# Patient Record
Sex: Female | Born: 1975 | Race: White | Hispanic: No
Health system: Midwestern US, Academic
[De-identification: ages and names within clinical notes are randomized; demographics above are authoritative.]

---

## 2017-04-22 ENCOUNTER — Encounter: Admit: 2017-04-22 | Discharge: 2017-04-23 | Payer: MEDICAID

## 2017-04-22 DIAGNOSIS — R69 Illness, unspecified: Principal | ICD-10-CM

## 2017-05-02 ENCOUNTER — Encounter: Admit: 2017-05-02 | Discharge: 2017-05-03 | Payer: MEDICAID

## 2017-05-02 DIAGNOSIS — R69 Illness, unspecified: Principal | ICD-10-CM

## 2017-05-25 ENCOUNTER — Ambulatory Visit: Admit: 2017-05-25 | Discharge: 2017-05-25 | Payer: MEDICAID

## 2017-05-25 ENCOUNTER — Encounter: Admit: 2017-05-25 | Discharge: 2017-05-25 | Payer: MEDICAID

## 2017-05-25 ENCOUNTER — Ambulatory Visit: Admit: 2017-05-25 | Discharge: 2017-05-25 | Payer: BC Managed Care – PPO

## 2017-05-25 DIAGNOSIS — M542 Cervicalgia: Principal | ICD-10-CM

## 2017-05-25 DIAGNOSIS — F319 Bipolar disorder, unspecified: ICD-10-CM

## 2017-05-25 DIAGNOSIS — M549 Dorsalgia, unspecified: ICD-10-CM

## 2017-05-25 DIAGNOSIS — K219 Gastro-esophageal reflux disease without esophagitis: ICD-10-CM

## 2017-05-25 DIAGNOSIS — D649 Anemia, unspecified: ICD-10-CM

## 2017-05-25 DIAGNOSIS — J45909 Unspecified asthma, uncomplicated: ICD-10-CM

## 2017-05-25 DIAGNOSIS — I10 Essential (primary) hypertension: Principal | ICD-10-CM

## 2017-05-25 DIAGNOSIS — E785 Hyperlipidemia, unspecified: ICD-10-CM

## 2017-05-25 DIAGNOSIS — M79601 Pain in right arm: ICD-10-CM

## 2017-05-25 DIAGNOSIS — F329 Major depressive disorder, single episode, unspecified: ICD-10-CM

## 2017-05-25 DIAGNOSIS — F419 Anxiety disorder, unspecified: ICD-10-CM

## 2017-05-25 DIAGNOSIS — G8929 Other chronic pain: ICD-10-CM

## 2017-05-25 DIAGNOSIS — M1990 Unspecified osteoarthritis, unspecified site: ICD-10-CM

## 2017-05-25 DIAGNOSIS — R69 Illness, unspecified: Principal | ICD-10-CM

## 2017-05-25 DIAGNOSIS — M4802 Spinal stenosis, cervical region: Secondary | ICD-10-CM

## 2017-05-25 DIAGNOSIS — N83209 Unspecified ovarian cyst, unspecified side: ICD-10-CM

## 2017-05-25 DIAGNOSIS — R4020 Unspecified coma: ICD-10-CM

## 2017-05-26 ENCOUNTER — Encounter: Admit: 2017-05-26 | Discharge: 2017-05-26 | Payer: MEDICAID

## 2017-05-26 DIAGNOSIS — M542 Cervicalgia: Principal | ICD-10-CM

## 2017-06-06 ENCOUNTER — Ambulatory Visit: Admit: 2017-06-06 | Discharge: 2017-06-06 | Payer: BC Managed Care – PPO

## 2017-06-06 ENCOUNTER — Ambulatory Visit: Admit: 2017-06-06 | Discharge: 2017-06-06 | Payer: MEDICAID

## 2017-06-06 ENCOUNTER — Encounter: Admit: 2017-06-06 | Discharge: 2017-06-06 | Payer: MEDICAID

## 2017-06-06 DIAGNOSIS — M5021 Other cervical disc displacement,  high cervical region: ICD-10-CM

## 2017-06-06 DIAGNOSIS — G8929 Other chronic pain: ICD-10-CM

## 2017-06-06 DIAGNOSIS — M2578 Osteophyte, vertebrae: ICD-10-CM

## 2017-06-06 DIAGNOSIS — M4802 Spinal stenosis, cervical region: Principal | ICD-10-CM

## 2017-06-06 DIAGNOSIS — M79601 Pain in right arm: ICD-10-CM

## 2017-06-06 MED ORDER — IOPAMIDOL 61 % IT SOLN
15 mL | Freq: Once | INTRATHECAL | 0 refills | Status: CP
Start: 2017-06-06 — End: ?
  Administered 2017-06-06: 18:00:00 15 mL via INTRATHECAL

## 2017-06-06 MED ORDER — DIAZEPAM 5 MG PO TAB
10 mg | Freq: Once | ORAL | 0 refills | Status: CP
Start: 2017-06-06 — End: ?
  Administered 2017-06-06: 17:00:00 10 mg via ORAL

## 2017-06-06 MED ORDER — HYDROCODONE-ACETAMINOPHEN 5-325 MG PO TAB
1 | Freq: Once | ORAL | 0 refills | Status: DC
Start: 2017-06-06 — End: 2017-06-06

## 2017-06-29 ENCOUNTER — Encounter: Admit: 2017-06-29 | Discharge: 2017-06-29 | Payer: MEDICAID

## 2017-06-29 ENCOUNTER — Ambulatory Visit: Admit: 2017-06-29 | Discharge: 2017-06-30 | Payer: MEDICAID

## 2017-06-29 DIAGNOSIS — M1990 Unspecified osteoarthritis, unspecified site: ICD-10-CM

## 2017-06-29 DIAGNOSIS — F419 Anxiety disorder, unspecified: ICD-10-CM

## 2017-06-29 DIAGNOSIS — R4020 Unspecified coma: ICD-10-CM

## 2017-06-29 DIAGNOSIS — M549 Dorsalgia, unspecified: ICD-10-CM

## 2017-06-29 DIAGNOSIS — D649 Anemia, unspecified: ICD-10-CM

## 2017-06-29 DIAGNOSIS — N83209 Unspecified ovarian cyst, unspecified side: ICD-10-CM

## 2017-06-29 DIAGNOSIS — G8929 Other chronic pain: Secondary | ICD-10-CM

## 2017-06-29 DIAGNOSIS — K219 Gastro-esophageal reflux disease without esophagitis: ICD-10-CM

## 2017-06-29 DIAGNOSIS — I10 Essential (primary) hypertension: Principal | ICD-10-CM

## 2017-06-29 DIAGNOSIS — F319 Bipolar disorder, unspecified: ICD-10-CM

## 2017-06-29 DIAGNOSIS — E785 Hyperlipidemia, unspecified: ICD-10-CM

## 2017-06-29 DIAGNOSIS — J45909 Unspecified asthma, uncomplicated: ICD-10-CM

## 2017-06-29 DIAGNOSIS — M79601 Pain in right arm: Principal | ICD-10-CM

## 2017-06-29 DIAGNOSIS — F329 Major depressive disorder, single episode, unspecified: ICD-10-CM

## 2017-06-30 ENCOUNTER — Ambulatory Visit: Admit: 2017-06-29 | Discharge: 2017-06-29 | Payer: MEDICAID

## 2017-06-30 DIAGNOSIS — M79601 Pain in right arm: Principal | ICD-10-CM

## 2017-06-30 DIAGNOSIS — G8929 Other chronic pain: ICD-10-CM

## 2017-06-30 DIAGNOSIS — M4802 Spinal stenosis, cervical region: ICD-10-CM

## 2017-07-01 ENCOUNTER — Encounter: Admit: 2017-07-01 | Discharge: 2017-07-01 | Payer: MEDICAID

## 2017-07-01 DIAGNOSIS — G959 Disease of spinal cord, unspecified: Principal | ICD-10-CM

## 2017-07-04 ENCOUNTER — Encounter: Admit: 2017-07-04 | Discharge: 2017-07-04 | Payer: MEDICAID

## 2017-07-13 ENCOUNTER — Encounter: Admit: 2017-07-13 | Discharge: 2017-07-13 | Payer: MEDICAID

## 2017-07-13 ENCOUNTER — Ambulatory Visit: Admit: 2017-07-13 | Discharge: 2017-07-13 | Payer: MEDICAID

## 2017-07-13 ENCOUNTER — Ambulatory Visit: Admit: 2017-07-13 | Discharge: 2017-07-14 | Payer: MEDICAID

## 2017-07-13 DIAGNOSIS — I10 Essential (primary) hypertension: Principal | ICD-10-CM

## 2017-07-13 DIAGNOSIS — B009 Herpesviral infection, unspecified: ICD-10-CM

## 2017-07-13 DIAGNOSIS — B977 Papillomavirus as the cause of diseases classified elsewhere: ICD-10-CM

## 2017-07-13 DIAGNOSIS — E162 Hypoglycemia, unspecified: ICD-10-CM

## 2017-07-13 DIAGNOSIS — J45909 Unspecified asthma, uncomplicated: ICD-10-CM

## 2017-07-13 DIAGNOSIS — M549 Dorsalgia, unspecified: ICD-10-CM

## 2017-07-13 DIAGNOSIS — R4020 Unspecified coma: ICD-10-CM

## 2017-07-13 DIAGNOSIS — D649 Anemia, unspecified: ICD-10-CM

## 2017-07-13 DIAGNOSIS — F319 Bipolar disorder, unspecified: ICD-10-CM

## 2017-07-13 DIAGNOSIS — F419 Anxiety disorder, unspecified: ICD-10-CM

## 2017-07-13 DIAGNOSIS — N83209 Unspecified ovarian cyst, unspecified side: ICD-10-CM

## 2017-07-13 DIAGNOSIS — K219 Gastro-esophageal reflux disease without esophagitis: ICD-10-CM

## 2017-07-13 DIAGNOSIS — F329 Major depressive disorder, single episode, unspecified: ICD-10-CM

## 2017-07-13 DIAGNOSIS — M1990 Unspecified osteoarthritis, unspecified site: ICD-10-CM

## 2017-07-13 DIAGNOSIS — G959 Disease of spinal cord, unspecified: Secondary | ICD-10-CM

## 2017-07-13 LAB — COMPREHENSIVE METABOLIC PANEL
Lab: 0.6 mg/dL (ref 0.4–1.00)
Lab: 107 MMOL/L (ref 98–110)
Lab: 136 MMOL/L — ABNORMAL LOW (ref 137–147)
Lab: 3.6 MMOL/L (ref 3.5–5.1)
Lab: 5 mg/dL — ABNORMAL LOW (ref 7–25)
Lab: 75 mg/dL (ref 70–100)
Lab: 9.3 mg/dL (ref 8.5–10.6)

## 2017-07-13 LAB — CBC: Lab: 9.6 10*3/uL (ref 4.5–11.0)

## 2017-07-14 DIAGNOSIS — M4802 Spinal stenosis, cervical region: Principal | ICD-10-CM

## 2017-07-14 DIAGNOSIS — G959 Disease of spinal cord, unspecified: ICD-10-CM

## 2017-07-21 ENCOUNTER — Encounter: Admit: 2017-07-21 | Discharge: 2017-07-21 | Payer: MEDICAID

## 2017-08-25 ENCOUNTER — Encounter: Admit: 2017-08-25 | Discharge: 2017-08-25 | Payer: MEDICAID

## 2017-08-25 DIAGNOSIS — B977 Papillomavirus as the cause of diseases classified elsewhere: ICD-10-CM

## 2017-08-25 DIAGNOSIS — E162 Hypoglycemia, unspecified: ICD-10-CM

## 2017-08-25 DIAGNOSIS — B009 Herpesviral infection, unspecified: ICD-10-CM

## 2017-08-25 DIAGNOSIS — F319 Bipolar disorder, unspecified: ICD-10-CM

## 2017-08-25 DIAGNOSIS — D649 Anemia, unspecified: ICD-10-CM

## 2017-08-25 DIAGNOSIS — M549 Dorsalgia, unspecified: ICD-10-CM

## 2017-08-25 DIAGNOSIS — F329 Major depressive disorder, single episode, unspecified: ICD-10-CM

## 2017-08-25 DIAGNOSIS — F419 Anxiety disorder, unspecified: ICD-10-CM

## 2017-08-25 DIAGNOSIS — I10 Essential (primary) hypertension: Principal | ICD-10-CM

## 2017-08-25 DIAGNOSIS — N83209 Unspecified ovarian cyst, unspecified side: ICD-10-CM

## 2017-08-25 DIAGNOSIS — K219 Gastro-esophageal reflux disease without esophagitis: ICD-10-CM

## 2017-08-25 DIAGNOSIS — R4020 Unspecified coma: ICD-10-CM

## 2017-08-25 DIAGNOSIS — J45909 Unspecified asthma, uncomplicated: ICD-10-CM

## 2017-08-25 DIAGNOSIS — M1990 Unspecified osteoarthritis, unspecified site: ICD-10-CM

## 2017-09-07 ENCOUNTER — Encounter: Admit: 2017-09-07 | Discharge: 2017-09-07 | Payer: MEDICAID

## 2017-09-07 DIAGNOSIS — M4802 Spinal stenosis, cervical region: Principal | ICD-10-CM

## 2017-09-14 ENCOUNTER — Encounter: Admit: 2017-09-14 | Discharge: 2017-09-14 | Payer: MEDICAID

## 2017-09-20 ENCOUNTER — Encounter: Admit: 2017-09-20 | Discharge: 2017-09-20 | Payer: MEDICAID

## 2017-09-20 MED ORDER — METHYLPREDNISOLONE 4 MG PO DSPK
ORAL_TABLET | 0 refills | Status: AC
Start: 2017-09-20 — End: 2018-11-01

## 2017-09-27 ENCOUNTER — Encounter: Admit: 2017-09-27 | Discharge: 2017-09-27 | Payer: MEDICAID

## 2018-06-23 ENCOUNTER — Encounter: Admit: 2018-06-23 | Discharge: 2018-06-24 | Payer: MEDICAID

## 2018-08-29 ENCOUNTER — Encounter: Admit: 2018-08-29 | Discharge: 2018-08-29 | Payer: MEDICAID

## 2018-09-11 ENCOUNTER — Encounter: Admit: 2018-09-11 | Discharge: 2018-09-11 | Payer: MEDICAID

## 2018-10-23 ENCOUNTER — Encounter: Admit: 2018-10-23 | Discharge: 2018-10-23

## 2018-10-23 DIAGNOSIS — M542 Cervicalgia: Secondary | ICD-10-CM

## 2018-10-23 DIAGNOSIS — M545 Low back pain: Secondary | ICD-10-CM

## 2018-11-01 ENCOUNTER — Ambulatory Visit: Admit: 2018-11-01 | Discharge: 2018-11-01

## 2018-11-01 ENCOUNTER — Encounter: Admit: 2018-11-01 | Discharge: 2018-11-01

## 2018-11-01 DIAGNOSIS — M542 Cervicalgia: Principal | ICD-10-CM

## 2018-11-01 DIAGNOSIS — M1990 Unspecified osteoarthritis, unspecified site: Secondary | ICD-10-CM

## 2018-11-01 DIAGNOSIS — E162 Hypoglycemia, unspecified: Secondary | ICD-10-CM

## 2018-11-01 DIAGNOSIS — K219 Gastro-esophageal reflux disease without esophagitis: Secondary | ICD-10-CM

## 2018-11-01 DIAGNOSIS — M5020 Other cervical disc displacement, unspecified cervical region: Secondary | ICD-10-CM

## 2018-11-01 DIAGNOSIS — M5010 Cervical disc disorder with radiculopathy, unspecified cervical region: Secondary | ICD-10-CM

## 2018-11-01 DIAGNOSIS — N83209 Unspecified ovarian cyst, unspecified side: Secondary | ICD-10-CM

## 2018-11-01 DIAGNOSIS — J45909 Unspecified asthma, uncomplicated: Secondary | ICD-10-CM

## 2018-11-01 DIAGNOSIS — R4020 Unspecified coma: Secondary | ICD-10-CM

## 2018-11-01 DIAGNOSIS — F329 Major depressive disorder, single episode, unspecified: Secondary | ICD-10-CM

## 2018-11-01 DIAGNOSIS — B009 Herpesviral infection, unspecified: Secondary | ICD-10-CM

## 2018-11-01 DIAGNOSIS — F419 Anxiety disorder, unspecified: Secondary | ICD-10-CM

## 2018-11-01 DIAGNOSIS — M4802 Spinal stenosis, cervical region: Secondary | ICD-10-CM

## 2018-11-01 DIAGNOSIS — D649 Anemia, unspecified: Secondary | ICD-10-CM

## 2018-11-01 DIAGNOSIS — I10 Essential (primary) hypertension: Secondary | ICD-10-CM

## 2018-11-01 DIAGNOSIS — B977 Papillomavirus as the cause of diseases classified elsewhere: Secondary | ICD-10-CM

## 2018-11-01 DIAGNOSIS — F319 Bipolar disorder, unspecified: Secondary | ICD-10-CM

## 2018-11-01 DIAGNOSIS — M549 Dorsalgia, unspecified: Secondary | ICD-10-CM

## 2018-11-01 NOTE — Progress Notes
Date of Service: 11/01/2018     Subjective:               History of Present Illness    Diamond Morton is a 43 y.o. female.  She presents with a chief complaint of neck pain radiating into her bilateral shoulders, and numbness in her bilateral hands.  This is been going on for well over a year.  This is failed nonoperative management with anti-inflammatories, pain medications, muscle relaxants, oral steroids, physical therapy, and injections.  It has been getting progressively worse.  She states she can no longer feel anything in her fingers, she has trouble with fine motor movements such as buttoning her close, and often drops things held in her hands because she cannot feel them.  She cannot identify any mitigating or exacerbating factors.       Review of Systems   Constitutional: Negative.    HENT: Positive for sneezing.    Eyes: Positive for itching.   Cardiovascular: Negative.    Gastrointestinal: Positive for diarrhea.   Endocrine: Negative.    Genitourinary: Positive for urgency.   Musculoskeletal: Positive for back pain, neck pain and neck stiffness.   Skin: Negative.    Neurological: Positive for dizziness, syncope, light-headedness and numbness.   Hematological: Bruises/bleeds easily.   Psychiatric/Behavioral: Positive for sleep disturbance. The patient is nervous/anxious.          Objective:         ??? clindamycin (CLEOCIN) 300 mg capsule Take 300 mg by mouth three times daily.   ??? diclofenac sodium DR (VOLTAREN) 50 mg tablet Take 50 mg by mouth three times daily. Take with food.    ??? ferrous sulfate (FEOSOL, FEROSUL) 325 mg (65 mg iron) tablet Take 325 mg by mouth every morning.   ??? HYDROcodone/acetaminophen (NORCO) 5/325 mg tablet Take 1 tablet by mouth every 6 hours as needed (Patient taking differently: Take 1-2 tablets by mouth every 8 hours as needed )   ??? LORazepam (ATIVAN) 0.5 mg tablet Take 1 tablet by mouth every 6 hours as needed for Other.... ??? losartan(+) (COZAAR) 100 mg tablet Take 100 mg by mouth every morning.   ??? methocarbamol (ROBAXIN) 750 mg tablet Take 750 mg by mouth three times daily.   ??? methylPREDNIsolone (MEDROL DOSEPAK) 4 mg tablet Take medication as directed on package for 6 days. Take with food.   ??? omeprazole DR(+) (PRILOSEC) 20 mg capsule Take 1 capsule by mouth every morning.     Vitals:    11/01/18 0919   BP: 123/63   Pulse: 83   Weight: 78.6 kg (173 lb 3.2 oz)   Height: 170.2 cm (67)   PainSc: Seven     Body mass index is 27.13 kg/m???.       Physical Exam  Vitals signs and nursing note reviewed.   Constitutional:       Appearance: Normal appearance.   HENT:      Head: Normocephalic and atraumatic.      Right Ear: External ear normal.      Left Ear: External ear normal.      Nose: Nose normal.   Eyes:      Conjunctiva/sclera: Conjunctivae normal.   Neck:      Musculoskeletal: Normal range of motion and neck supple.   Pulmonary:      Effort: Pulmonary effort is normal.   Musculoskeletal: Normal range of motion.      Thoracic back: Normal.      Lumbar  back: Normal.        Back:    Skin:     General: Skin is warm and dry.   Neurological:      Mental Status: She is alert and oriented to person, place, and time.      Cranial Nerves: Cranial nerves are intact.      Sensory: Sensory deficit (Numbness in bilateral hands) present.      Motor: Motor function is intact.      Gait: Gait is intact.      Deep Tendon Reflexes: Reflexes abnormal.      Reflex Scores:       Tricep reflexes are 3+ on the right side and 3+ on the left side.       Bicep reflexes are 3+ on the right side and 3+ on the left side.       Brachioradialis reflexes are 3+ on the right side and 3+ on the left side.       Patellar reflexes are 3+ on the right side and 3+ on the left side.  Psychiatric:         Mood and Affect: Mood normal.         Behavior: Behavior normal.       I independently reviewed MRI of the cervical spine from 06/23/2018.  This demonstrates a large central disc herniation at C3/4 causing spinal cord impingement and severe stenosis.  I also independently reviewed plain films from clinic today.  These demonstrate straightening of the cervical lordosis and large anterior bridging osteophytes extending from C4-C7.       Assessment and Plan:  I am recommending a C3/4 anterior cervical discectomy and fusion.  Surgical procedure, risks, benefits, and alternatives were discussed with patient in detail.  Oswestry Total Score:: 16

## 2018-11-01 NOTE — Patient Instructions
Planning for surgery December 05, 2018-Anterior Cervical Discectomy & Fusion C3-4        Lelon Mast RN, BSN  Clinical Nurse Coordinator-[facility] - [facility] - Inpatient A. [facility] - Neurosurgery- Dr. Burman Freestone  Ph: [phone] - Fax: [phone] - Email:[email or web address]

## 2018-11-02 ENCOUNTER — Encounter: Admit: 2018-11-02 | Discharge: 2018-11-02

## 2018-11-02 DIAGNOSIS — M5010 Cervical disc disorder with radiculopathy, unspecified cervical region: Secondary | ICD-10-CM

## 2018-11-02 DIAGNOSIS — Z1159 Encounter for screening for other viral diseases: Secondary | ICD-10-CM

## 2018-11-02 DIAGNOSIS — G9520 Unspecified cord compression: Secondary | ICD-10-CM

## 2018-11-02 DIAGNOSIS — M5011 Cervical disc disorder with radiculopathy,  high cervical region: Principal | ICD-10-CM

## 2018-11-03 ENCOUNTER — Encounter: Admit: 2018-11-03 | Discharge: 2018-11-03

## 2018-11-17 ENCOUNTER — Encounter: Admit: 2018-11-17 | Discharge: 2018-11-17

## 2018-11-17 ENCOUNTER — Ambulatory Visit: Admit: 2018-12-05 | Discharge: 2018-12-05

## 2018-11-17 DIAGNOSIS — B009 Herpesviral infection, unspecified: Secondary | ICD-10-CM

## 2018-11-17 DIAGNOSIS — D649 Anemia, unspecified: Secondary | ICD-10-CM

## 2018-11-17 DIAGNOSIS — M5010 Cervical disc disorder with radiculopathy, unspecified cervical region: Secondary | ICD-10-CM

## 2018-11-17 DIAGNOSIS — K219 Gastro-esophageal reflux disease without esophagitis: Secondary | ICD-10-CM

## 2018-11-17 DIAGNOSIS — M1990 Unspecified osteoarthritis, unspecified site: Secondary | ICD-10-CM

## 2018-11-17 DIAGNOSIS — N83209 Unspecified ovarian cyst, unspecified side: Secondary | ICD-10-CM

## 2018-11-17 DIAGNOSIS — F329 Major depressive disorder, single episode, unspecified: Secondary | ICD-10-CM

## 2018-11-17 DIAGNOSIS — J45909 Unspecified asthma, uncomplicated: Secondary | ICD-10-CM

## 2018-11-17 DIAGNOSIS — M549 Dorsalgia, unspecified: Secondary | ICD-10-CM

## 2018-11-17 DIAGNOSIS — R4020 Unspecified coma: Secondary | ICD-10-CM

## 2018-11-17 DIAGNOSIS — F319 Bipolar disorder, unspecified: Secondary | ICD-10-CM

## 2018-11-17 DIAGNOSIS — I10 Essential (primary) hypertension: Secondary | ICD-10-CM

## 2018-11-17 DIAGNOSIS — E162 Hypoglycemia, unspecified: Secondary | ICD-10-CM

## 2018-11-17 DIAGNOSIS — F419 Anxiety disorder, unspecified: Secondary | ICD-10-CM

## 2018-11-17 LAB — CBC
Lab: 12 % (ref 11–15)
Lab: 14 g/dL (ref 12.0–15.0)
Lab: 282 K/UL (ref >60)
Lab: 33 pg — ABNORMAL LOW (ref 26–34)
Lab: 34 g/dL (ref 32.0–36.0)
Lab: 4.2 M/UL (ref 4.0–5.0)
Lab: 40 % (ref 36–45)
Lab: 8.5 FL (ref >60)
Lab: 9.9 K/UL (ref 4.5–11.0)
Lab: 96 FL (ref 80–100)

## 2018-11-17 LAB — BASIC METABOLIC PANEL: Lab: 138 MMOL/L (ref 137–147)

## 2018-11-17 NOTE — Pre-Anesthesia Patient Instructions
GENERAL INFORMATION    Before you come to the hospital  ??? Make arrangements for a responsible adult to drive you home and stay with you for 24 hours following surgery.  ??? Bath/Shower Instructions  ??? Take a bath or shower using the special soap given to you in PAC. Use half the bottle the night before, and the other half the morning of your procedure. Use clean towels with each bath or shower.  ??? Put on clean clothes after bath or shower.  Avoid using lotion and oils.  ??? Sleep on clean sheets if bath or shower is done the night before procedure.  ??? Leave money, credit cards, jewelry, and any other valuables at home. [facility] is not responsible for the loss or breakage of personal items.  ??? Remove nail polish, makeup and all jewelry (including piercings) before coming to the hospital.  ??? The morning of your procedure:  ??? brush your teeth and tongue  ??? do not smoke  ??? do not shave the area where you will have surgery    What to bring to the hospital  ??? ID/ Insurance Card  ??? Medical Device card  ??? Official documents for legal guardianship   ??? Copy of your Living Will, Advanced Directives, and/or Durable Power of Attorney   ??? Small bag with a few personal belongings  ??? Cases for glasses/hearing aids/contact lens (bring solutions for contacts)  ??? Dress in clean, loose, comfortable clothing     Eating or drinking before surgery  ??? Do not eat or drink anything after 11:00 p.m. the day before your procedure (including gum, mints, candy, or chewing tobacco) OR follow the specific instructions you were given by your Surgeon.  ??? You may have WATER ONLY up to 2 hours before arriving at the hospital.  ??? Other instructions: ***     Other instructions  Notify your surgeon if:  ??? there is a possibility that you are pregnant  ??? you become ill with a cough, fever, sore throat, nausea, vomiting or flu-like symptoms  ??? you have any open wounds/sores that are red, painful, draining, or are new since you last saw  the doctor  ??? you need to cancel your procedure  ??? You will receive a call with your surgery arrival time from between 2:30pm and 4:30pm the last business day before your procedure.  If you do not receive a call, please call [phone] before 4:30pm or [phone] after 4:30pm.    Notify us at [facility]: [phone]  ??? if you need to cancel your procedure  ??? if you are going to be late    Arrival at the hospital    [facility]  [address]  [place], [place] [address]    ? Park in the P5 parking garage located at [employer], [place], [place] [address].   ? Judee Clara parking is available in front of [employer] [facility] between the hours of 7:00 am and 4:00 pm Monday through Friday.  ? If parking in the P5 garage, take the east elevators in the parking garage to the second level and walk to the entrance of the [employer] [facility].    ? Enter through the 1st floor main entrance and check in with Information Desk.   ? If you are a woman between the ages of 60 and 29, and have not had a hysterectomy, you will be asked for a urine sample prior to  surgery.  Please do not urinate before arriving in the Surgery Waiting Room.  Once there, check in and let the attendant know if you need to provide a sample.    For the safety of all patients, visitors and staff as we work to contain COVID-19, we must dramatically restrict patient visitors.  Current Visitor Policy (10/26/18):    One visitor per patient per day.  Exceptions include:    Two parents/guardian for patients younger than 73.  End-of-life patients may be allowed additional support persons.  Visitors should check with the patient's nurse.  Only cancer patients at their exam visit may have one visitor with them.  No visitors are allowed for cancer patients receiving treatment/infusion services.  This applies at all cancer center locations.  Restrictions still apply for patients who test positive for COVID-19 (no visitors).    Visitors will continue to be screened at all entrances.  They must be free of fever and symptoms to be in our facilities.  We ask visitors to follow these guidelines:  Wear a mask at all times.  Go directly to the nursing station in the unit you are visiting and do not linger in public areas.  Check in at the nursing station before going to the patient's room.  Maintain a physical distance of six feet from all others.  Follow elevator restrictions to four riding at a time - peak times are 6:30-7:30 a.m., noon and 6:30-7:30 p.m.  Be aware cafeteria peak times are 11 a.m. - 1 p.m.  Wash your hands frequently and cover your coughs and sneezes.    Coronavirus (COVID19) Information  If you get sick with fever (100.77F/38C or higher), cough, or have trouble breathing:  ? Call your primary care physician for questions or health needs  ? Tell your doctor about any recent travel and your symptoms.  ? Avoid contact with others.  ? Notify Designer, industrial/product.  For up to date information on the Coronavirus, visit the CDC website at Dining[employer]de.

## 2018-11-17 NOTE — Progress Notes
Patient instructed in visitation restrictions due to covid 19.  Also instructed in covid 19 symptoms.  All instructions added to PAC AVS.  Acknowledged understanding.

## 2018-11-18 ENCOUNTER — Ambulatory Visit: Admit: 2018-11-17 | Discharge: 2018-11-18

## 2018-11-18 DIAGNOSIS — Z1159 Encounter for screening for other viral diseases: Principal | ICD-10-CM

## 2018-12-03 DIAGNOSIS — Z1159 Encounter for screening for other viral diseases: Secondary | ICD-10-CM

## 2018-12-03 NOTE — Progress Notes
Patient arrived to [facility] clinic for COVID-19 testing 12/03/18 @ 1110. Patient identity confirmed via photo I.D. Nasopharyngeal procedure explained to the patient.   Nasopharyngeal swab completed right  Patient education provided given and instructed patient self isolate until contacted w/ results and further instructions.   Swab collected by Bernette Mayers PSR.Marland Kitchen    Date symptoms began/reason for testing:  Pre-op

## 2018-12-04 ENCOUNTER — Ambulatory Visit: Admit: 2018-12-03 | Discharge: 2018-12-04

## 2018-12-04 ENCOUNTER — Encounter: Admit: 2018-12-04 | Discharge: 2018-12-04

## 2018-12-04 LAB — COVID-19 (SARS-COV-2) PCR

## 2018-12-05 ENCOUNTER — Encounter: Admit: 2018-12-05 | Discharge: 2018-12-05

## 2018-12-05 DIAGNOSIS — B009 Herpesviral infection, unspecified: Secondary | ICD-10-CM

## 2018-12-05 DIAGNOSIS — E162 Hypoglycemia, unspecified: Secondary | ICD-10-CM

## 2018-12-05 DIAGNOSIS — K219 Gastro-esophageal reflux disease without esophagitis: Secondary | ICD-10-CM

## 2018-12-05 DIAGNOSIS — F419 Anxiety disorder, unspecified: Secondary | ICD-10-CM

## 2018-12-05 DIAGNOSIS — D649 Anemia, unspecified: Secondary | ICD-10-CM

## 2018-12-05 DIAGNOSIS — F329 Major depressive disorder, single episode, unspecified: Secondary | ICD-10-CM

## 2018-12-05 DIAGNOSIS — F319 Bipolar disorder, unspecified: Secondary | ICD-10-CM

## 2018-12-05 DIAGNOSIS — M5011 Cervical disc disorder with radiculopathy,  high cervical region: Principal | ICD-10-CM

## 2018-12-05 DIAGNOSIS — M549 Dorsalgia, unspecified: Secondary | ICD-10-CM

## 2018-12-05 DIAGNOSIS — J45909 Unspecified asthma, uncomplicated: Secondary | ICD-10-CM

## 2018-12-05 DIAGNOSIS — N83209 Unspecified ovarian cyst, unspecified side: Secondary | ICD-10-CM

## 2018-12-05 DIAGNOSIS — I10 Essential (primary) hypertension: Secondary | ICD-10-CM

## 2018-12-05 DIAGNOSIS — R4020 Unspecified coma: Secondary | ICD-10-CM

## 2018-12-05 DIAGNOSIS — M1990 Unspecified osteoarthritis, unspecified site: Secondary | ICD-10-CM

## 2018-12-05 LAB — PREGNANCY TEST-URINE
Lab: 1
Lab: NEGATIVE

## 2018-12-05 LAB — POC GLUCOSE: Lab: 101 mg/dL — ABNORMAL HIGH (ref 70–100)

## 2018-12-05 MED ORDER — HALOPERIDOL LACTATE 5 MG/ML IJ SOLN
1 mg | Freq: Once | INTRAVENOUS | 0 refills | Status: DC | PRN
Start: 2018-12-05 — End: 2018-12-06

## 2018-12-05 MED ORDER — SUFENTANIL CITRATE 50 MCG/ML IV SOLN
0 refills | Status: DC
Start: 2018-12-05 — End: 2018-12-05

## 2018-12-05 MED ORDER — THROMBIN (BOVINE) 5,000 UNIT TP SOLR
0 refills | Status: DC
Start: 2018-12-05 — End: 2018-12-05
  Administered 2018-12-05: 20:00:00 5000 [IU] via TOPICAL

## 2018-12-05 MED ORDER — LOSARTAN 50 MG PO TAB
100 mg | Freq: Every morning | ORAL | 0 refills | Status: DC
Start: 2018-12-05 — End: 2018-12-06
  Administered 2018-12-06: 14:00:00 100 mg via ORAL

## 2018-12-05 MED ORDER — ALBUTEROL SULFATE 90 MCG/ACTUATION IN HFAA
2 | RESPIRATORY_TRACT | 0 refills | Status: DC | PRN
Start: 2018-12-05 — End: 2018-12-06

## 2018-12-05 MED ORDER — HYDROMORPHONE (PF) 2 MG/ML IJ SYRG
1 mg | INTRAVENOUS | 0 refills | Status: DC | PRN
Start: 2018-12-05 — End: 2018-12-06
  Administered 2018-12-05 (×4): 0.5 mg via INTRAVENOUS

## 2018-12-05 MED ORDER — SUGAMMADEX 100 MG/ML IV SOLN
INTRAVENOUS | 0 refills | Status: DC
Start: 2018-12-05 — End: 2018-12-05
  Administered 2018-12-05: 22:00:00 166 mg via INTRAVENOUS

## 2018-12-05 MED ORDER — SUFENTANIL 100 MCG IN NS 10 ML (OR)
0 refills | Status: DC
Start: 2018-12-05 — End: 2018-12-05
  Administered 2018-12-05 (×2): 5 ug via INTRAVENOUS
  Administered 2018-12-05 (×2): 0.2 ug/kg/h via INTRAVENOUS

## 2018-12-05 MED ORDER — ONDANSETRON HCL (PF) 4 MG/2 ML IJ SOLN
INTRAVENOUS | 0 refills | Status: DC
Start: 2018-12-05 — End: 2018-12-05
  Administered 2018-12-05: 21:00:00 4 mg via INTRAVENOUS

## 2018-12-05 MED ORDER — SENNOSIDES-DOCUSATE SODIUM 8.6-50 MG PO TAB
1 | Freq: Two times a day (BID) | ORAL | 0 refills | Status: DC
Start: 2018-12-05 — End: 2018-12-06

## 2018-12-05 MED ORDER — DEXTRAN 70-HYPROMELLOSE (PF) 0.1-0.3 % OP DPET
0 refills | Status: DC
Start: 2018-12-05 — End: 2018-12-05
  Administered 2018-12-05: 19:00:00 2 [drp] via OPHTHALMIC

## 2018-12-05 MED ORDER — MAGNESIUM HYDROXIDE 2,400 MG/10 ML PO SUSP
10 mL | Freq: Every day | ORAL | 0 refills | Status: DC
Start: 2018-12-05 — End: 2018-12-06

## 2018-12-05 MED ORDER — ACETAMINOPHEN 325 MG PO TAB
650 mg | ORAL | 0 refills | Status: DC | PRN
Start: 2018-12-05 — End: 2018-12-06
  Administered 2018-12-06 (×2): 650 mg via ORAL

## 2018-12-05 MED ORDER — GABAPENTIN 300 MG PO CAP
600 mg | Freq: Once | ORAL | 0 refills | Status: CP
Start: 2018-12-05 — End: ?
  Administered 2018-12-05: 15:00:00 600 mg via ORAL

## 2018-12-05 MED ORDER — FERROUS SULFATE 325 MG (65 MG IRON) PO TAB
325 mg | Freq: Every morning | ORAL | 0 refills | Status: DC
Start: 2018-12-05 — End: 2018-12-06
  Administered 2018-12-06: 14:00:00 325 mg via ORAL

## 2018-12-05 MED ORDER — CEFAZOLIN INJ 1GM IVP
2 g | INTRAVENOUS | 0 refills | Status: DC
Start: 2018-12-05 — End: 2018-12-06
  Administered 2018-12-06 (×2): 2 g via INTRAVENOUS

## 2018-12-05 MED ORDER — LOSARTAN 50 MG PO TAB
50 mg | Freq: Once | ORAL | 0 refills | Status: CP
Start: 2018-12-05 — End: ?
  Administered 2018-12-06: 03:00:00 50 mg via ORAL

## 2018-12-05 MED ORDER — OXYCODONE 5 MG PO TAB
5-10 mg | ORAL | 0 refills | Status: DC | PRN
Start: 2018-12-05 — End: 2018-12-06
  Administered 2018-12-05 – 2018-12-06 (×2): 10 mg via ORAL
  Administered 2018-12-06 (×2): 5 mg via ORAL

## 2018-12-05 MED ORDER — LORAZEPAM 0.5 MG PO TAB
.5 mg | ORAL | 0 refills | Status: DC | PRN
Start: 2018-12-05 — End: 2018-12-06

## 2018-12-05 MED ORDER — SUCCINYLCHOLINE CHLORIDE 20 MG/ML IJ SOLN
INTRAVENOUS | 0 refills | Status: DC
Start: 2018-12-05 — End: 2018-12-05
  Administered 2018-12-05: 19:00:00 100 mg via INTRAVENOUS

## 2018-12-05 MED ORDER — PHENYLEPHRINE IN 0.9% NACL(PF) 1 MG/10 ML (100 MCG/ML) IV SYRG
INTRAVENOUS | 0 refills | Status: DC
Start: 2018-12-05 — End: 2018-12-05
  Administered 2018-12-05: 19:00:00 100 ug via INTRAVENOUS
  Administered 2018-12-05: 22:00:00 10 ug via INTRAVENOUS

## 2018-12-05 MED ORDER — LORATADINE 10 MG PO TAB
20 mg | Freq: Every day | ORAL | 0 refills | Status: DC
Start: 2018-12-05 — End: 2018-12-06
  Administered 2018-12-06: 14:00:00 20 mg via ORAL

## 2018-12-05 MED ORDER — ROCURONIUM 10 MG/ML IV SOLN
INTRAVENOUS | 0 refills | Status: DC
Start: 2018-12-05 — End: 2018-12-05
  Administered 2018-12-05: 20:00:00 10 mg via INTRAVENOUS
  Administered 2018-12-05: 19:00:00 40 mg via INTRAVENOUS

## 2018-12-05 MED ORDER — DOCUSATE SODIUM 100 MG PO CAP
100 mg | Freq: Two times a day (BID) | ORAL | 0 refills | Status: DC
Start: 2018-12-05 — End: 2018-12-06

## 2018-12-05 MED ORDER — PROPOFOL INJ 10 MG/ML IV VIAL
0 refills | Status: DC
Start: 2018-12-05 — End: 2018-12-05
  Administered 2018-12-05: 19:00:00 160 mg via INTRAVENOUS

## 2018-12-05 MED ORDER — PROMETHAZINE 25 MG/ML IJ SOLN
6.25 mg | INTRAVENOUS | 0 refills | Status: DC | PRN
Start: 2018-12-05 — End: 2018-12-06

## 2018-12-05 MED ORDER — DEXAMETHASONE SODIUM PHOSPHATE 4 MG/ML IJ SOLN
INTRAVENOUS | 0 refills | Status: DC
Start: 2018-12-05 — End: 2018-12-05
  Administered 2018-12-05: 19:00:00 4 mg via INTRAVENOUS

## 2018-12-05 MED ORDER — SODIUM CHLORIDE 0.9 % IV SOLP
INTRAVENOUS | 0 refills | Status: DC
Start: 2018-12-05 — End: 2018-12-06
  Administered 2018-12-05: 15:00:00 1000.000 mL via INTRAVENOUS

## 2018-12-05 MED ORDER — PROPOFOL 10 MG/ML IV EMUL 10 ML (INFUSION)(AM)(OR)
0 refills | Status: DC
Start: 2018-12-05 — End: 2018-12-05
  Administered 2018-12-05 (×2): 10.000 mL via INTRAVENOUS
  Administered 2018-12-05: 19:00:00 150 ug/kg/min via INTRAVENOUS
  Administered 2018-12-05 (×3): 10.000 mL via INTRAVENOUS

## 2018-12-05 MED ORDER — VANCOMYCIN 1,000 MG IV SOLR
0 refills | Status: DC
Start: 2018-12-05 — End: 2018-12-05
  Administered 2018-12-05: 21:00:00 1 g via TOPICAL

## 2018-12-05 MED ORDER — PANTOPRAZOLE 40 MG PO TBEC
40 mg | Freq: Every day | ORAL | 0 refills | Status: DC
Start: 2018-12-05 — End: 2018-12-06
  Administered 2018-12-06: 14:00:00 40 mg via ORAL

## 2018-12-05 MED ORDER — ONDANSETRON HCL (PF) 4 MG/2 ML IJ SOLN
4 mg | INTRAVENOUS | 0 refills | Status: DC | PRN
Start: 2018-12-05 — End: 2018-12-06

## 2018-12-05 MED ORDER — FENTANYL CITRATE (PF) 50 MCG/ML IJ SOLN
25 ug | INTRAVENOUS | 0 refills | Status: DC | PRN
Start: 2018-12-05 — End: 2018-12-06

## 2018-12-05 MED ORDER — CEFAZOLIN 1 GRAM IJ SOLR
0 refills | Status: DC
Start: 2018-12-05 — End: 2018-12-05
  Administered 2018-12-05: 19:00:00 2 g via INTRAVENOUS

## 2018-12-05 MED ORDER — DIPHENHYDRAMINE HCL 50 MG/ML IJ SOLN
25 mg | Freq: Once | INTRAVENOUS | 0 refills | Status: DC | PRN
Start: 2018-12-05 — End: 2018-12-06

## 2018-12-05 MED ORDER — ACETAMINOPHEN 500 MG PO TAB
1000 mg | Freq: Once | ORAL | 0 refills | Status: CP
Start: 2018-12-05 — End: ?
  Administered 2018-12-05: 15:00:00 1000 mg via ORAL

## 2018-12-05 MED ORDER — DULOXETINE 30 MG PO CPDR
30 mg | Freq: Every day | ORAL | 0 refills | Status: DC
Start: 2018-12-05 — End: 2018-12-06
  Administered 2018-12-06: 14:00:00 30 mg via ORAL

## 2018-12-05 MED ORDER — DIPHENHYDRAMINE HCL 50 MG PO CAP
50 mg | ORAL | 0 refills | Status: DC | PRN
Start: 2018-12-05 — End: 2018-12-06
  Administered 2018-12-06: 14:00:00 50 mg via ORAL

## 2018-12-05 MED ORDER — LIDOCAINE (PF) 200 MG/10 ML (2 %) IJ SYRG
0 refills | Status: DC
Start: 2018-12-05 — End: 2018-12-05
  Administered 2018-12-05: 19:00:00 80 mg via INTRAVENOUS

## 2018-12-05 MED ORDER — METHOCARBAMOL 750 MG PO TAB
750 mg | ORAL | 0 refills | Status: DC | PRN
Start: 2018-12-05 — End: 2018-12-06

## 2018-12-05 MED ORDER — ELECTROLYTE-A IV SOLP
0 refills | Status: DC
Start: 2018-12-05 — End: 2018-12-05
  Administered 2018-12-05: 19:00:00 via INTRAVENOUS

## 2018-12-05 MED ORDER — LIDOCAINE (PF) 10 MG/ML (1 %) IJ SOLN
.1-2 mL | INTRAMUSCULAR | 0 refills | Status: DC | PRN
Start: 2018-12-05 — End: 2018-12-06

## 2018-12-05 MED ORDER — MIDAZOLAM 1 MG/ML IJ SOLN
INTRAVENOUS | 0 refills | Status: DC
Start: 2018-12-05 — End: 2018-12-05
  Administered 2018-12-05: 19:00:00 2 mg via INTRAVENOUS

## 2018-12-05 MED ORDER — CEFAZOLIN 1 GRAM IJ SOLR
0 refills | Status: DC
Start: 2018-12-05 — End: 2018-12-05
  Administered 2018-12-05: 21:00:00 1000 mg via INTRAVENOUS

## 2018-12-05 MED ORDER — BISACODYL 10 MG RE SUPP
10 mg | Freq: Every day | RECTAL | 0 refills | Status: DC
Start: 2018-12-05 — End: 2018-12-06

## 2018-12-05 NOTE — Operative Report(Direct Entry)
OPERATIVE REPORT    Name: Diamond Morton is a 43 y.o. 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]    Procedure(s):  CERVICAL 3-4 ANTERIOR DISCECTOMY AND FUSION  INSERTION INTERBODY BIOMECHANICAL DEVICE WITH/ WITHOUT INTEGRAL ANTERIOR INSTRUMENTATION FOR DEVICE ANCHORING TO INTERVERTEBRAL DISC SPACE IN CONJUNCTION WITH INTERBODY ARTHRODESIS - EACH INTERSPACE  ANTERIOR INSTRUMENTATION - 2 TO 3 VERTEBRAL SEGMENTS  ALLOGRAFT/ MORSELIZED/ PLACEMENT OSTEOPROMOTIVE MATERIAL - SPINE SURGERY ONLY    Anesthesia Type: General    Indications for Procedure: See full notes for details; in brief a 43 year-old female who presents with cervical myelopathy with spinal cord compression at C3-4 with cervical stenosis refractory to medical and conservative management.    Description and Findings of Operative Procedure:   After obtaining informed consent, patient was brought back to the operating room where orotracheal anesthesia was induced.  A shoulder roll was placed underneath his shoulders to allow for neck extension.  A lateral radiograph was obtained for localization purposes. The patient was prepped and draped in the usual sterile fashion.  A proper verbal timeout was performed.  ???  10 blade scalpel was used to make a linear incision on the right side of the neck.  Bipolar cautery was used to obtain hemostasis.  The platysma was cut sharply.  An avascular plane was found down to the spine.  We found the anterior spine and placed a spinal needle at C3-4 which was confirmed with a lateral radiograph.  The longus coli muscles were mobilized and blackbelt retractors placed.  The ET tube cuff was deflated and reinflated.  Discectomy was started in the usual manner using a high-speed drill, Kerrisons, and curettes. Caspar pins were placed and gentle distraction was allowed.    ???  The operating room microscope was brought into the field and used for the remainder of the case for microdissection.  We completed the discectomy down to the posterior longitudinal ligament and the posterior osteophyte.  This was removed with high-speed drill and Kerrisons.  The dura was identified and the PLL was removed.  We were especially careful to remove any impingement on the right side.  There were large fragments of disc in the central area causing cord compression as well as fragments behind the vertebral bodies which we teased out using nerve hooks then removed with pituitaries.  Once the thecal sac was decompressed, as well as the neuroforamina bilaterally, hemostasis was obtained.  ???  Trial spacers were used and a 6 lordotic structural allograft spacer was placed.  The Caspar pins were removed and distraction was removed.  The Globus instrumentation system was utilized and a 12 mm plate was sized appropriately and four 12 mm self-tapping screws were placed.  Hemostasis was obtained.  Another lateral radiograph was obtained which showed the instrumentation was in good position.  We carefully inspected the important neurovascular structures which appeared in good position.  The carotid was pulsatile  laterally and the esophagus was medial and in good position. Ancef irrigation was utilized and hemostasis was obtained.  ???  A 4-0 Vicryl suture was used to close the platysma in a simple running fashion.  The skin was closed using a 5-0 Vicryl suture in a running subcuticular fashion.  The incision was cleaned and dressed with Dermabond.  The patient was transferred to the gurney, extubated, and taken to the recovery room in stable fashion.  All sponge and needle counts were correct on final count.      Estimated Blood Loss: 50 mL    Specimen(s) Removed/Disposition: None    Complications: None    Implants: Globus instrumentation as outlined above    Drains: None    Please call [phone] with questions.    Glo Herring, MD  [phone] or on [place]

## 2018-12-05 NOTE — H&P (View-Only)
Admission History and Physical Examination      Name:  Diamond Morton                                             MRN:  [id number]   Admission Date:  12/05/2018                     Assessment/Plan:    Severe cervical stenosis with myelopathy. I am recommending a C3/4 anterior cervical discectomy and fusion.  Surgical procedure, risks, benefits, and alternatives were discussed with patient in detail.  __________________________________________________________________________________  Primary Care Physician: Rockwell Germany  Verified    Chief Complaint:  Cervical myelopathy  History of Present Illness: Diamond Morton is a 43 y.o. female  She presents with a chief complaint of neck pain radiating into her bilateral shoulders, and numbness in her bilateral hands.  This is been going on for well over a year.  This is failed nonoperative management with anti-inflammatories, pain medications, muscle relaxants, oral steroids, physical therapy, and injections.  It has been getting progressively worse.  She states she can no longer feel anything in her fingers, she has trouble with fine motor movements such as buttoning her clothes, and often drops things held in her hands because she cannot feel them.  She cannot identify any mitigating or exacerbating factors.  ???  Review of Systems   Constitutional: Negative.    HENT: Positive for sneezing.    Eyes: Positive for itching.   Cardiovascular: Negative.    Gastrointestinal: Positive for diarrhea.   Endocrine: Negative.    Genitourinary: Positive for urgency.   Musculoskeletal: Positive for back pain, neck pain and neck stiffness.   Skin: Negative.    Neurological: Positive for dizziness, syncope, light-headedness and numbness.   Hematological: Bruises/bleeds easily.   Psychiatric/Behavioral: Positive for sleep disturbance. The patient is nervous/anxious.    ???        Medical History:   Diagnosis Date   ??? Anemia     oral iron supplement   ??? Anxiety disorder ??? Arthritis    ??? Asthma     Reactive asthma per patient, inhaler and nebulizer   ??? Back pain    ??? Bipolar affective disorder (HCC)    ??? Coma (HCC) 1985    d/t PCN reaction   ??? Depression    ??? GERD (gastroesophageal reflux disease)     protonix   ??? Herpes simplex    ??? Hypertension    ??? Hypoglycemia    ??? Ovarian cyst 01/30/1991    resolved with surgery     Surgical History:   Procedure Laterality Date   ??? TONSILLECTOMY  1985   ??? CYST REMOVAL Right 1992    Ovarian Cyst   ??? DILATION AND CURETTAGE  2011   ??? CHOLECYSTECTOMY  2013   ??? RIGHT LAMINECTOMY LUMBAR 4 TO SACRAL 1 WITH FORAMINOTOMIES Right 12/19/2015    Performed by Storm Frisk, MD at [facility] OR     Family History   Problem Relation Age of Onset   ??? Aneurysm Father    ??? Heart problem Father    ??? Hypertension Father    ??? Arthritis Sister    ??? Hypertension Brother    ??? Back pain Brother    ??? Back pain Daughter    ??? Scoliosis Daughter  Social History     Socioeconomic History   ??? Marital status: Divorced     Spouse name: Not on file   ??? Number of children: 2   ??? Years of education: Not on file   ??? Highest education level: Not on file   Occupational History   ??? Occupation: EMPLOYED    Social Needs   ??? Financial resource strain: Not on file   ??? Food insecurity     Worry: Not on file     Inability: Not on file   ??? Transportation needs     Medical: Not on file     Non-medical: Not on file   Tobacco Use   ??? Smoking status: Former Smoker     Packs/day: 2.00     Years: 27.00     Pack years: 54.00     Types: Cigarettes     Last attempt to quit: 12/21/2016     Years since quitting: 1.9   ??? Smokeless tobacco: Former Neurosurgeon     Types: Chew     Quit date: 07/13/1997   Substance and Sexual Activity   ??? Alcohol use: Yes     Alcohol/week: 0.0 standard drinks     Comment: occassional- per month, maybe 15 drinks   ??? Drug use: No   ??? Sexual activity: Not on file   Lifestyle   ??? Physical activity     Days per week: Not on file     Minutes per session: Not on file ??? Stress: Not on file   Relationships   ??? Social Wellsite geologist on phone: Not on file     Gets together: Not on file     Attends religious service: Not on file     Active member of club or organization: Not on file     Attends meetings of clubs or organizations: Not on file     Relationship status: Not on file   ??? Intimate partner violence     Fear of current or ex partner: Not on file     Emotionally abused: Not on file     Physically abused: Not on file     Forced sexual activity: Not on file   Other Topics Concern   ??? Not on file   Social History Narrative   ??? Not on file      Vaping/E-liquid Use   ??? Vaping Use Never User        Immunizations (includes history and patient reported):   There is no immunization history on file for this patient.        Allergies:  Fentanyl; Onion; Pcn [penicillins]; and Sulfa (sulfonamide antibiotics)    Medications:  Current Facility-Administered Medications   Medication   ??? lidocaine PF 1% (10 mg/mL) injection 0.1-2 mL   ??? sodium chloride 0.9 %   infusion         Physical Exam  Vitals signs and nursing note reviewed.   Constitutional:       Appearance: Normal appearance.   HENT:      Head: Normocephalic and atraumatic.      Right Ear: External ear normal.      Left Ear: External ear normal.      Nose: Nose normal.   Eyes:      Conjunctiva/sclera: Conjunctivae normal.   Neck:      Musculoskeletal: Normal range of motion and neck supple.   Pulmonary:      Effort: Pulmonary effort  is normal.   Musculoskeletal: Normal range of motion.   Skin:     General: Skin is warm and dry.   Neurological:      Mental Status: She is alert and oriented to person, place, and time.      Sensory: Sensory deficit (numbness in bilateral hands) present.      Motor: Motor function is intact.      Deep Tendon Reflexes:      Reflex Scores:       Tricep reflexes are 3+ on the right side.       Bicep reflexes are 3+ on the right side and 3+ on the left side. Patellar reflexes are 3+ on the right side and 3+ on the left side.  Psychiatric:         Mood and Affect: Mood normal.         Behavior: Behavior normal.       I independently reviewed MRI of the cervical spine from 06/23/2018.  This demonstrates a large central disc herniation at C3/4 causing spinal cord impingement and severe stenosis.  I also independently reviewed plain films from clinic today.  These demonstrate straightening of the cervical lordosis and large anterior bridging osteophytes extending from C4-C7.    Vital Signs: Last Filed In 24 Hours Vital Signs: 24 Hour Range   BP: 129/94 (07/14 0952)  Temp: 36.9 ???C (98.4 ???F) (07/14 0981)  Pulse: 83 (07/14 0952)  Respirations: 19 PER MINUTE (07/14 0952)  SpO2: 99 % (07/14 0952)  SpO2 Pulse: 83 (07/14 0952)  Height: 170.2 cm (67) (07/14 0952) BP: (129)/(94)   Temp:  [36.9 ???C (98.4 ???F)]   Pulse:  [83]   Respirations:  [19 PER MINUTE]   SpO2:  [99 %]    Intensity Pain Scale (Self Report): 5 (12/05/18 1914)            Lab/Radiology/Other Diagnostic Tests:  Pertinent labs reviewed  POC Glucose (Download): (!) 101 (12/05/18 1008)  Pertinent radiology reviewed.    Pablo Iona, MD  Pager

## 2018-12-05 NOTE — Anesthesia Post-Procedure Evaluation
Post-Anesthesia Evaluation    Name: Diamond Morton      MRN: [id number]     DOB: 07-27-1975     Age: 43 y.o.     Sex: female   __________________________________________________________________________     Procedure Date: 12/05/2018  Procedure(s):  CERVICAL 3-4 ANTERIOR DISCECTOMY AND FUSION  INSERTION INTERBODY BIOMECHANICAL DEVICE WITH/ WITHOUT INTEGRAL ANTERIOR INSTRUMENTATION FOR DEVICE ANCHORING TO INTERVERTEBRAL [place] SPACE IN CONJUNCTION WITH INTERBODY ARTHRODESIS - EACH INTERSPACE  ANTERIOR INSTRUMENTATION - 2 TO 3 VERTEBRAL SEGMENTS  ALLOGRAFT/ MORSELIZED/ PLACEMENT OSTEOPROMOTIVE MATERIAL - SPINE SURGERY ONLY      Surgeon: Surgeon(s):  Masri-Elyafaoui, Jed Limerick, MD  Donzetta Starch, MD  Glennie Isle., MD    Post-Anesthesia Vitals  BP: 140/96 (07/14 1815)  Pulse: 88 (07/14 1815)  Respirations: 17 PER MINUTE (07/14 1815)  SpO2: 96 % (07/14 1815)  SpO2 Pulse: 86 (07/14 1815)   Vitals Value Taken Time   BP 140/96 12/05/2018  6:15 PM   Temp 37.1 C (98.8 F) 12/05/2018  5:03 PM   Pulse 88 12/05/2018  6:15 PM   Respirations 17 PER MINUTE 12/05/2018  6:15 PM   SpO2 96 % 12/05/2018  6:15 PM         Post Anesthesia Evaluation Note    Evaluation location: pre/post  Patient participation: recovered; patient participated in evaluation  Level of consciousness: alert  Pain management: adequate    Hydration: normovolemia  Temperature: 36.0C - 38.4C  Airway patency: adequate    Perioperative Events       Post-op nausea and vomiting: no PONV    Postoperative Status  Cardiovascular status: hemodynamically stable  Respiratory status: spontaneous ventilation  Follow-up needed: none        Perioperative Events  Perioperative Event: No  Emergency Case Activation: No

## 2018-12-06 ENCOUNTER — Ambulatory Visit: Admit: 2018-12-05 | Discharge: 2018-12-05

## 2018-12-06 ENCOUNTER — Encounter: Admit: 2018-12-05 | Discharge: 2018-12-06

## 2018-12-06 ENCOUNTER — Ambulatory Visit: Admit: 2018-11-17 | Discharge: 2018-11-17

## 2018-12-06 DIAGNOSIS — M1990 Unspecified osteoarthritis, unspecified site: Secondary | ICD-10-CM

## 2018-12-06 DIAGNOSIS — M5001 Cervical disc disorder with myelopathy,  high cervical region: Secondary | ICD-10-CM

## 2018-12-06 DIAGNOSIS — R21 Rash and other nonspecific skin eruption: Secondary | ICD-10-CM

## 2018-12-06 DIAGNOSIS — F419 Anxiety disorder, unspecified: Secondary | ICD-10-CM

## 2018-12-06 DIAGNOSIS — M4802 Spinal stenosis, cervical region: Secondary | ICD-10-CM

## 2018-12-06 DIAGNOSIS — K219 Gastro-esophageal reflux disease without esophagitis: Secondary | ICD-10-CM

## 2018-12-06 DIAGNOSIS — M2578 Osteophyte, vertebrae: Secondary | ICD-10-CM

## 2018-12-06 DIAGNOSIS — G992 Myelopathy in diseases classified elsewhere: Secondary | ICD-10-CM

## 2018-12-06 DIAGNOSIS — Z880 Allergy status to penicillin: Secondary | ICD-10-CM

## 2018-12-06 DIAGNOSIS — G959 Disease of spinal cord, unspecified: Secondary | ICD-10-CM

## 2018-12-06 DIAGNOSIS — G9520 Unspecified cord compression: Secondary | ICD-10-CM

## 2018-12-06 DIAGNOSIS — Z87891 Personal history of nicotine dependence: Secondary | ICD-10-CM

## 2018-12-06 DIAGNOSIS — I10 Essential (primary) hypertension: Secondary | ICD-10-CM

## 2018-12-06 DIAGNOSIS — M5010 Cervical disc disorder with radiculopathy, unspecified cervical region: Secondary | ICD-10-CM

## 2018-12-06 DIAGNOSIS — M5011 Cervical disc disorder with radiculopathy,  high cervical region: Principal | ICD-10-CM

## 2018-12-06 MED ORDER — SENNOSIDES-DOCUSATE SODIUM 8.6-50 MG PO TAB
1 | ORAL_TABLET | Freq: Two times a day (BID) | ORAL | 0 refills | Status: DC
Start: 2018-12-06 — End: 2019-02-01

## 2018-12-06 MED ORDER — ACETAMINOPHEN 325 MG PO TAB
650 mg | ORAL_TABLET | ORAL | 0 refills | Status: DC | PRN
Start: 2018-12-06 — End: 2019-02-01

## 2018-12-06 MED ORDER — OXYCODONE 5 MG PO TAB
5-10 mg | ORAL_TABLET | ORAL | 0 refills | 6.00000 days | Status: DC | PRN
Start: 2018-12-06 — End: 2018-12-19

## 2018-12-06 NOTE — Progress Notes
RT Adult Assessment Note    NAME:Evangelyn Zyria Fiscus             MRN: [id number]             DOB:Apr 24, 1976          AGE: 43 y.o.  ADMISSION DATE: 12/05/2018             DAYS ADMITTED: LOS: 0 days    RT Treatment Plan:  Protocol Plan: Medications  Albuterol: MDI PRN(Home Regimen)         Additional Comments:  Impressions of the patient: Patient in no distress.  Intervention(s)/outcome(s): Continue home regimen inhaler.  Patient education that was completed: None  Recommendations to the care team: None    Vital Signs:  Pulse: 86  RR: 16 PER MINUTE  SpO2: 97 %  O2 Device: Cannula  Liter Flow: 2 Lpm  O2%:    Breath Sounds: Clear (Implies normal)  Respiratory Effort: Non-Labored

## 2018-12-06 NOTE — Discharge Instructions - Pharmacy
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 Anemia     oral iron supplement   ??? Anxiety disorder    ??? Arthritis    ??? Asthma     Reactive asthma per patient, inhaler and nebulizer   ??? Back pain    ??? Bipolar affective disorder (HCC)    ??? Coma (HCC) 1985    d/t PCN reaction   ??? Depression    ??? GERD (gastroesophageal reflux disease)     protonix   ??? Herpes simplex    ??? Hypertension    ??? Hypoglycemia    ??? Ovarian cyst 01/30/1991    resolved with surgery     Allergies: Fentanyl; Onion; Pcn [penicillins]; and Sulfa (sulfonamide antibiotics)    Admission Physical Exam notable for:  Constitutional:       Appearance: Normal appearance.   HENT:      Head: Normocephalic and atraumatic.      Right Ear: External ear normal.      Left Ear: External ear normal.      Nose: Nose normal.   Eyes:      Conjunctiva/sclera: Conjunctivae normal.   Neck:      Musculoskeletal: Normal range of motion and neck supple.   Pulmonary:      Effort: Pulmonary effort is normal.   Musculoskeletal: Normal range of motion.   Skin:     General: Skin is warm and dry.   Neurological:      Mental Status: She is alert and oriented to person, place, and time.      Sensory: Sensory deficit (numbness in bilateral hands) present.      Motor: Motor function is intact.      Deep Tendon Reflexes:      Reflex Scores:       Tricep reflexes are 3+ on the right side.       Bicep reflexes are 3+ on the right side and 3+ on the left side. Patellar reflexes are 3+ on the right side and 3+ on the left side.  Psychiatric:         Mood and Affect: Mood normal.         Behavior: Behavior normal.     Admission Lab/Radiology studies notable for: MRI of the cervical spine from 06/23/2018 demonstrates a large central disc herniation at C3/4 causing spinal cord impingement and severe stenosis. ???Plain films from clinic demonstrate straightening of the cervical lordosis and large anterior bridging osteophytes extending from C4-C7.    Brief Hospital Course:  The patient was admitted and the following issues were addressed during this hospitalization: (with  pertinent details).  Patient underwent C3-4 ACDF on 12/05/2018.  Patient tolerated the procedure well without complications, and was admitted to the neurosurgery service post-operatively.  Patient progressed to a regular diet before discharge.  Pain was well controlled and by time of discharge the patient was tolerating a PO pain regimen.  Patient was medically stable for discharge to home on 12/06/2018. Patient will have follow-up in our clinic as well as outpatient PT and OT scripts for neck strengthening and range of motion.    Condition at Discharge: Stable    Discharge Diagnoses:   Hospital Problems        Active Problems    * (Principal) Cervical myelopathy (HCC)    Chronic pain of right upper extremity    Spinal cord compression (HCC)       Resolved Problems    RESOLVED: Stenosis of cervical spine with myelopathy (HCC)      Cervical herniated disc    Surgical Procedures: C3-4 ACDF with structural allograft and anterior plating    Significant Diagnostic Studies and Procedures: noted in brief hospital course    Consults:  None    Patient Disposition: Home       Patient instructions/medications:       AMB REFERRAL TO PHYSICAL THERAPY   Referral Priority: Routine Referral Type: Consult, Test & Treat   Referral Reason: Specialty Services Required   Number of Visits Requested: 1 Expiration Date: 12/06/19 AMB REFERRAL TO OCCUPATIONAL THERAPY   Referral Priority: Routine Referral Type: Consult, Test & Treat   Referral Reason: Specialty Services Required   Number of Visits Requested: 1 Expiration Date: 12/06/19     Regular Diet    You have no dietary restriction. Please continue with a healthy balanced diet.     Report These Signs and Symptoms    Please contact your doctor if you have any of the following symptoms: temperature higher than 101.5 degrees F, uncontrolled pain, persistent nausea and/or vomiting, difficulty breathing, chest pain, severe abdominal pain, unable to urinate, unable to have bowel movement, drainage with a foul odor, severe headache, weakness, or other concern.     Questions About Your Stay    For questions or concerns regarding your hospital stay:    - DURING BUSINESS HOURS (8:00 AM - 4:30 PM):    Call [phone] and asked to be transferred to your discharge attending physician.    - AFTER BUSINESS HOURS (4:30 PM - 8:00 AM, on weekends, or holidays):  Call [phone] and ask the operator to page the on-call doctor for the discharge attending physician.     Discharging attending physician: Pablo Balm [[id number]]      Driving Restrictions    No driving while taking pain medication and until physician approval at follow-up appointment.     Lifting Restrictions    Do not lift more than 10 pounds until after follow-up appointment.     Opioid (Narcotic) Safety Information    OPIOID (NARCOTIC) PAIN MEDICATION SAFETY    We care about your comfort, and believe you need opioid medications at this time to treat your pain.  An opioid is a strong pain medication.  It is only available by prescription for moderate to severe pain.  Usually these medications are used for only a short time to treat pain, but sometimes will be prescribed for longer.  Talk with your doctor or nurse about how long they expect you to need this medication. When used the right way, opioids are safe and effective  medications to treat your pain, even when used for a long time.  Yet, when used in the wrong way, opioids can be dangerous for you or others.  Opioids do not work for everyone.  Most patients do not get full relief of their pain from opioid medication; full relief of your pain may not be possible.     For your safety, we ask you to follow these instructions:    *Only take your opioid medication as prescribed.  If your pain is not controlled with the prescribed dose, or the medication is not lasting long enough, call your doctor.  *Do not break or crush your opioid medication unless your doctor or pharmacist says you can.  With certain medications, this can be dangerous, and may cause death.  *Never share your medications with others, even if they appear to have a good reason.  Never take someone else's pain medication-this is dangerous, and illegal (a crime).  Overdoses and deaths have occurred.  *Keep your opioid medications safe, as you would with cash, in a lock box or similar container.  *Make sure your opioids are going to be secure, especially if you are around children or teens.  *Talk with your doctor or pharmacist before you take other medications.  *Avoid driving, operating machinery, or drinking alcohol while taking opioid pain medication.  This may be unsafe.    Pain medications can cause constipation. Constipation is bowel movements that are less often than normal. Stools often become very hard and difficult to pass. This may lead to stomach pain and bloating. It may also cause pain when trying to use the bathroom. Constipation may be treated with suppositories, laxatives or stool softeners. A diet high in fiber with plenty of fluids helps to maintain regular, soft bowel movements.     Incision Care    *Keep your incision clean and dry.  *May shower 3 days following procedure. Avoid direct water contact to the incision. Take sponge baths, working around the incision during this time.  *Do not submerge incision in tub, pool, hot tub, or lake for 4 weeks.  *Usually there are not stitches to be removed. Steri-strips (strips of tape) will begin to fall off in 10-14 days. If they remain after 2 weeks, gently remove them when they are damp after a shower.  *Your incision should gradually look better each day. If you notice unusual swelling, redness, drainage, have increasing pain at the site, or have a fever greater than 100 degrees, notify your physician immediately.      Current Discharge Medication List       START taking these medications    Details   acetaminophen (TYLENOL) 325 mg tablet Take two tablets by mouth every 4 hours as needed.  Qty: 90 tablet, Refills: 0    PRESCRIPTION TYPE:  Normal      oxyCODONE (ROXICODONE) 5 mg tablet Take one tablet to two tablets by mouth every 4-6 hours as needed  Qty: 40 tablet, Refills: 0    PRESCRIPTION TYPE:  Normal      senna/docusate (SENOKOT-S) 8.6/50 mg tablet Take one tablet by mouth twice daily. Take while taking narcotics to avoid constipation.  Qty: 90 tablet, Refills: 0    PRESCRIPTION TYPE:  Normal          CONTINUE these medications which have NOT CHANGED    Details   diphenhydrAMINE (BENADRYL) 50 mg capsule Take 50 mg by mouth every 6 hours as needed.    PRESCRIPTION TYPE:  Historical Med      duloxetine DR (CYMBALTA) 30 mg capsule Take 30 mg by mouth daily.    PRESCRIPTION TYPE:  Historical Med      ferrous sulfate (FEOSOL, FEROSUL) 325 mg (65 mg iron) tablet Take 325 mg by mouth every morning.    PRESCRIPTION TYPE:  Historical Med      loratadine (CLARITIN) 10 mg tablet Take 20 mg by mouth daily. Indications: allergies    PRESCRIPTION TYPE:  Historical Med      LORazepam (ATIVAN) 0.5 mg tablet Take 1 tablet by mouth every 6 hours as needed for Other....    PRESCRIPTION TYPE:  Historical Med      losartan(+) (COZAAR) 100 mg tablet Take 100 mg by mouth every morning. Refills: 3    PRESCRIPTION TYPE:  Historical Med      methocarbamol (ROBAXIN) 750 mg tablet Take 750 mg by mouth every 6 hours as needed.  Refills: 2    PRESCRIPTION TYPE:  Historical Med      pantoprazole DR (PROTONIX) 40 mg tablet Take 40 mg by mouth daily.    PRESCRIPTION TYPE:  Historical Med          The following medications were removed from your list. This list includes medications discontinued this stay and those removed from your prior med list in our system        meloxicam (MOBIC) 15 mg tablet            Scheduled appointments:    Dec 19, 2018  9:20 AM CDT  Post - Op with Kipp Laurence, APRN-NP  [facility] (NeuroSurgery) [address]  [place] [address]  [phone]   Jan 17, 2019  2:45 PM CDT  Post - Op with Pablo [place], MD  [facility]) [address]  [address]  [place] [address]  [phone]   Feb 01, 2019  1:00 PM CDT  New Patient with Alphia Kava, MD  [facility] (Internal Medicine) [address]  [place] [address]  [phone]          Pending items needing follow up: Outpatient PT/OT, follow-ups as above    Signed:  Glo Herring, MD  12/06/2018      cc:  Primary Care Physician:  Rockwell Germany PA  Referring physicians:  [place], Efraim Kaufmann, [place]   Additional provider(s): None

## 2018-12-06 NOTE — Case Management (ED)
CMA Note    Plan:  Patient to discharge to home today     Interventions:     Received request from [place] to assist with scheduling [facility] transport for pt to return home.  Transportation Company: [employer] [phone]  Transport time: [id number]  Confirmation #:  Instructions for driver: Call the unit at [phone] at least 20 minutes prior to arrival, come to the [facility] entrance.     RIDE CANCELLED - found a ride.    Updated SWCM.    Myrtie Soman  Case Management Assistant  For additional assistance please contact [place]

## 2018-12-06 NOTE — Progress Notes
Patient arrived to room # [phone]*) via bed accompanied by RN. Patient transferred to the STAYED IN BED. Bedside safety checks completed. Initial patient assessment completed. Refer to flowsheet for details.    Admission skin assessment completed with: Threasa Beards, RN    Pressure injury present on arrival?: No    1. Head/Face/Neck: No  2. Trunk/Back: No  3. Upper Extremities: No  4. Lower Extremities: No  5. Pelvic/Coccyx: No  6. Assessed for device associated injury? Yes  7. Malnutrition Screening Tool (Nursing Nutrition Assessment) Completed? Yes    See Doc Flowsheet for additional wound details.     INTERVENTIONS:

## 2018-12-06 NOTE — Progress Notes
Discharge instructions provided to pt. Pt verbalizes understanding. IV removed. Heron Nay, APRN stopped Ancef. Benadryl given for rash on bilateral arms. Per Heron Nay, APRN ok to discharge. Pt belongings gathered.

## 2018-12-06 NOTE — Progress Notes
Neurosurgery update:    Notified by nursing of patient with rash on bilateral arms. Patient noted rash this AM. Seen at bedside by examiner, patient calm and appropriate. No SOB or other concern. VSS this AM. She has complaints of itching along bilateral arms more distally, has flat speckled red rash that is scattered along wrists and forearms, some more around IV site. Also noted to have redness in square patter around incision/neck area from OR tape. Benadryl given. States she has tolerated oxycodone fine in the past, does have allergy to penicillin. Ancef stopped and added to allergy list. Plan to continue with discharge later this am if continues to do well. Instructed to use PRN benadryl at home as prescribed. She will notify us if rash worsens.    Luvenia Heller, APRN

## 2018-12-06 NOTE — Progress Notes
Neurosurgery Progress Note    SUBJECTIVE:  No acute events overnight. Preoperative left hand numbness is gone now postoperatively; right hand still numb but not worse.  Strength good, has ambulated to restroom, has urinated.  Tolerating PO food and drink well.  Pain controlled on pain regimen. Patient states the back of her neck has been sore lateraly and is a little more stiff now post-op and is wondering if she can have some outpatient PT/OT to improve her neck range of motion/strengthening.    OBJECTIVE:                  Vital Signs: 24 Hour Range   BP: (113-151)/(51-103)   Temp:  [36.6 C (97.9 F)-37.1 C (98.8 F)]   Pulse:  [70-103]   Respirations:  [9 PER MINUTE-19 PER MINUTE]   SpO2:  [92 %-100 %]      Exam  Awake, alert, oriented  Pupils equal and reactive bilaterally  Moves all extremities without focal deficit  Some numbness to light touch in right fingers, otherwise intact to light touch in hands/feet  Incision clean, dry, intact with dermabond in place    ASSESSMENT/PLAN:     43 y.o. female with spinal cord compression from C3-4 herniated disc with cervical myelopathy now POD 1 from C3-4 ACDF.    --Anticipate discharge home today with outpatient PT/OT scripts  --Patient in agreement with plan; all questions answered  --Discussed with staff    Prophylaxis:   A) GI: PPI  B) Lines:  No  C) Urinary Catheter:  No  D) Antibiotic Usage:  No  E) VTE:  Mechanical prophylaxis; Sequential compression device  F) Restraints: Patient assessed for need for restraints.     Please call [phone] with any questions.    Glennie Isle, MD   [phone] or Hetty Ely

## 2018-12-06 NOTE — Discharge Instructions - Supplementary Instructions
Diamond Morton, Diamond Morton    Anterior Cervical 3-4 Discectomy and Fusion on 12/05/2018 with Dr. Earlene Plater    Neurosurgery Discharge Instructions  Contact information:  ? Call Neurosurgery if you have questions or are experiencing problems at discharge [phone].  Post-operative wound care:  ? Your incision has glue in place. Your incision may be open to air.  ? Keep your incision dry for 3 days. Cover incision while showering until 12/08/2018.  Starting 12/09/2018 use baby shampoo to wash incision daily, pat dry and leave open to air.  ? Do not submerge (pool/tub) your incision under water at all for 4 weeks.  ? Have someone look at your incision every day. It should look the same or better daily.  ? Do not apply any ointment, cream or lotions to incision line.   Activity restrictions:  ? Avoid pushing, pulling, lifting or bending more than 10 pounds (about a gallon of milk). If you hold children, they should be placed in your lap or crawl into lap if old enough.  ? Do NOT drive until you are cleared by your physician.   Post-operative pain and medications:  ? Please use your pain medications and muscle relaxers as prescribed.  ? Pain medications can make you constipated. You may take a stool softener and Miralax.  ? Do NOT take Ibuprofen or NSAIDS (Aleve, Motrin, Naproxen) until Doctor approved.    ? Tylenol is approved for pain control.  This is available over the counter.   Follow up appointment:   ? 12/19/2018 @ 9:20 with Nurse Practitioner for wound check and follow up.  ? 01/17/2019 @ 2:45 with Dr. Earlene Plater Neurosurgeon for surgical follow up.  Please contact Neurosurgery if you develop any of the following:  ? New or worsening numbness, tingling, or decrease sensation in arms or legs.  ? New or worsening changes in mobility or gait (walking).  ? Fever 101 or greater.  Redness, swelling, continuous oozing, fluid collection, warmth or bad odor near the incision site. ? Intense pain that is getting worse or unrelieved by pain medications or muscle relaxers.

## 2018-12-06 NOTE — Care Coordination-Inpatient
Diamond Morton, Diamond Morton    Anterior Cervical 3-4 Discectomy and Fusion on 12/05/2018 with Dr. Earlene Plater    Neurosurgery Discharge Instructions    Contact information:  ? Call Neurosurgery if you have questions or are experiencing problems at discharge [phone].  Post-operative wound care:  ? Your incision has glue in place. Your incision may be open to air.  ? Keep your incision dry for 3 days. Cover incision while showering until 12/08/2018.  Starting 12/09/2018 use baby shampoo to wash incision daily, pat dry and leave open to air.  ? Do not submerge (pool/tub) your incision under water at all for 4 weeks.  ? Have someone look at your incision every day. It should look the same or better daily.  ? Do not apply any ointment, cream or lotions to incision line.   Activity restrictions:  ? Avoid pushing, pulling, lifting or bending more than 10 pounds (about a gallon of milk). If you hold children, they should be placed in your lap or crawl into lap if old enough.  ? Do NOT drive until you are cleared by your physician.   Post-operative pain and medications:  ? Please use your pain medications and muscle relaxers as prescribed.  ? Pain medications can make you constipated. You may take a stool softener and Miralax.  ? Do NOT take Ibuprofen or NSAIDS (Aleve, Motrin, Naproxen) until Doctor approved.    ? Tylenol is approved for pain control.  This is available over the counter.   Follow up appointment:   ? 12/19/2018 @ 9:20 with Nurse Practitioner for wound check and follow up.  ? 01/17/2019 @ 2:45 with Dr. Earlene Plater Neurosurgeon for surgical follow up.  Please contact Neurosurgery if you develop any of the following:  ? New or worsening numbness, tingling, or decrease sensation in arms or legs.  ? New or worsening changes in mobility or gait (walking).  ? Fever 101 or greater.  Redness, swelling, continuous oozing, fluid collection, warmth or bad odor near the incision site. ? Intense pain that is getting worse or unrelieved by pain medications or muscle relaxers.        Kem Kays, RN  Clinical Nurse Coordinator  Neurosurgery  Cell: [phone]  Office: [phone]

## 2018-12-06 NOTE — Progress Notes
Patient requesting morning dose of Losartan for BP control. Did not take this morning. MD notified, orders placed for 1/2 dose. MD also notified that patient refused all medications related to bowels. WCTMP.

## 2018-12-06 NOTE — Care Plan
Discussed AVS including: wound care, activity restrictions, medication instructions, goals of care at home, follow up appointment and clinic contact.

## 2018-12-11 ENCOUNTER — Encounter: Admit: 2018-12-11 | Discharge: 2018-12-11

## 2018-12-11 DIAGNOSIS — N83209 Unspecified ovarian cyst, unspecified side: Secondary | ICD-10-CM

## 2018-12-11 DIAGNOSIS — J45909 Unspecified asthma, uncomplicated: Secondary | ICD-10-CM

## 2018-12-11 DIAGNOSIS — K219 Gastro-esophageal reflux disease without esophagitis: Secondary | ICD-10-CM

## 2018-12-11 DIAGNOSIS — D649 Anemia, unspecified: Secondary | ICD-10-CM

## 2018-12-11 DIAGNOSIS — R4020 Unspecified coma: Secondary | ICD-10-CM

## 2018-12-11 DIAGNOSIS — B009 Herpesviral infection, unspecified: Secondary | ICD-10-CM

## 2018-12-11 DIAGNOSIS — F319 Bipolar disorder, unspecified: Secondary | ICD-10-CM

## 2018-12-11 DIAGNOSIS — M1990 Unspecified osteoarthritis, unspecified site: Secondary | ICD-10-CM

## 2018-12-11 DIAGNOSIS — F419 Anxiety disorder, unspecified: Secondary | ICD-10-CM

## 2018-12-11 DIAGNOSIS — F329 Major depressive disorder, single episode, unspecified: Secondary | ICD-10-CM

## 2018-12-11 DIAGNOSIS — M549 Dorsalgia, unspecified: Secondary | ICD-10-CM

## 2018-12-11 DIAGNOSIS — E162 Hypoglycemia, unspecified: Secondary | ICD-10-CM

## 2018-12-11 DIAGNOSIS — I10 Essential (primary) hypertension: Secondary | ICD-10-CM

## 2018-12-19 ENCOUNTER — Ambulatory Visit: Admit: 2018-12-19 | Discharge: 2018-12-19

## 2018-12-19 ENCOUNTER — Encounter: Admit: 2018-12-19 | Discharge: 2018-12-19

## 2018-12-19 DIAGNOSIS — R4020 Unspecified coma: Secondary | ICD-10-CM

## 2018-12-19 DIAGNOSIS — I10 Essential (primary) hypertension: Secondary | ICD-10-CM

## 2018-12-19 DIAGNOSIS — F329 Major depressive disorder, single episode, unspecified: Secondary | ICD-10-CM

## 2018-12-19 DIAGNOSIS — N83209 Unspecified ovarian cyst, unspecified side: Secondary | ICD-10-CM

## 2018-12-19 DIAGNOSIS — F319 Bipolar disorder, unspecified: Secondary | ICD-10-CM

## 2018-12-19 DIAGNOSIS — E162 Hypoglycemia, unspecified: Secondary | ICD-10-CM

## 2018-12-19 DIAGNOSIS — J45909 Unspecified asthma, uncomplicated: Secondary | ICD-10-CM

## 2018-12-19 DIAGNOSIS — M549 Dorsalgia, unspecified: Secondary | ICD-10-CM

## 2018-12-19 DIAGNOSIS — K219 Gastro-esophageal reflux disease without esophagitis: Secondary | ICD-10-CM

## 2018-12-19 DIAGNOSIS — Z5189 Encounter for other specified aftercare: Principal | ICD-10-CM

## 2018-12-19 DIAGNOSIS — M1990 Unspecified osteoarthritis, unspecified site: Secondary | ICD-10-CM

## 2018-12-19 DIAGNOSIS — B009 Herpesviral infection, unspecified: Secondary | ICD-10-CM

## 2018-12-19 DIAGNOSIS — F419 Anxiety disorder, unspecified: Secondary | ICD-10-CM

## 2018-12-19 DIAGNOSIS — D649 Anemia, unspecified: Secondary | ICD-10-CM

## 2018-12-19 DIAGNOSIS — M4802 Spinal stenosis, cervical region: Secondary | ICD-10-CM

## 2018-12-19 MED ORDER — METHOCARBAMOL 750 MG PO TAB
750 mg | ORAL_TABLET | ORAL | 1 refills | Status: AC | PRN
Start: 2018-12-19 — End: ?

## 2018-12-19 MED ORDER — OXYCODONE 5 MG PO TAB
5 mg | ORAL_TABLET | ORAL | 0 refills | 6.00000 days | Status: DC | PRN
Start: 2018-12-19 — End: 2019-02-01

## 2018-12-19 NOTE — Progress Notes
Neurosurgery Clinic Follow-up    Patient Active Problem List    Diagnosis Date Noted    Spinal cord compression ([place]) 12/05/2018    Cervical myelopathy ([place]) 12/05/2018    Herniation of cervical intervertebral disc with radiculopathy 11/01/2018    Chronic pain of right upper extremity 05/25/2017    Lumbar radiculopathy 09/29/2015    Synovial cyst 09/29/2015    Obtained patient's verbal consent to treat them and their agreement to [place] and NPP via this telehealth visit during the [facility]      Diamond Morton is seen today via tele health for 2 wk f/u s/p ACDF C3-4 by Dr. Rosana Hoes on 12/05/2018.  She reports her previous bilateral hand numbness has resolved.  She continues to have moderate posterior cervical discomfort and bilateral anterior clavicle muscle soreness.  She reports swallowing difficulty has improved. Denies voice hoarseness.   She denies any redness/drainage/swelling of her incision.    Physical Exam Obviously Limited by Tele Health    Alert/Fully Oriented  Speech Clear and Appropriate  Appears comfortable and in no distress.   Incision edges well approximated.  No S/S of infection noted.     Diamond Morton is seen today via tele health for 2 wk f/u.  Overall, she is doing well.  Incision is healing well without S/S of infection.  Wound care discussed.    Discussed her discomfort is normal for this point in her recovery.  Encouraged her to use hot or cold packs to had neck.  She should maintain a lifting restriction of 10# .  Will reorder methocarbamol for muscle tension and a small refill of oxycodone.  She should only use the oxycodone when her pain is severe and unrelieved by Tylenol.  She voiced understanding.  She will f/u with Dr. Rosana Hoes on 01/17/2019.    Please call [phone] with questions or concern    [place], APRN-NP

## 2018-12-19 NOTE — Patient Instructions
N/A

## 2018-12-23 ENCOUNTER — Encounter: Admit: 2018-12-23 | Discharge: 2018-12-23

## 2019-01-02 ENCOUNTER — Encounter: Admit: 2019-01-02 | Discharge: 2019-01-02

## 2019-01-04 ENCOUNTER — Encounter: Admit: 2019-01-04 | Discharge: 2019-01-04

## 2019-01-09 ENCOUNTER — Encounter: Admit: 2019-01-09 | Discharge: 2019-01-09 | Payer: MEDICAID

## 2019-01-09 DIAGNOSIS — M4802 Spinal stenosis, cervical region: Secondary | ICD-10-CM

## 2019-01-10 ENCOUNTER — Encounter: Admit: 2019-01-10 | Discharge: 2019-01-10

## 2019-01-11 ENCOUNTER — Encounter: Admit: 2019-01-11 | Discharge: 2019-01-11

## 2019-01-17 ENCOUNTER — Ambulatory Visit: Admit: 2019-01-17 | Discharge: 2019-01-17

## 2019-01-17 ENCOUNTER — Encounter: Admit: 2019-01-17 | Discharge: 2019-01-17

## 2019-01-17 DIAGNOSIS — I10 Essential (primary) hypertension: Secondary | ICD-10-CM

## 2019-01-17 DIAGNOSIS — D649 Anemia, unspecified: Secondary | ICD-10-CM

## 2019-01-17 DIAGNOSIS — M4802 Spinal stenosis, cervical region: Secondary | ICD-10-CM

## 2019-01-17 DIAGNOSIS — M1990 Unspecified osteoarthritis, unspecified site: Secondary | ICD-10-CM

## 2019-01-17 DIAGNOSIS — R4020 Unspecified coma: Secondary | ICD-10-CM

## 2019-01-17 DIAGNOSIS — F419 Anxiety disorder, unspecified: Secondary | ICD-10-CM

## 2019-01-17 DIAGNOSIS — F319 Bipolar disorder, unspecified: Secondary | ICD-10-CM

## 2019-01-17 DIAGNOSIS — F329 Major depressive disorder, single episode, unspecified: Secondary | ICD-10-CM

## 2019-01-17 DIAGNOSIS — M5010 Cervical disc disorder with radiculopathy, unspecified cervical region: Secondary | ICD-10-CM

## 2019-01-17 DIAGNOSIS — Z981 Arthrodesis status: Secondary | ICD-10-CM

## 2019-01-17 DIAGNOSIS — K219 Gastro-esophageal reflux disease without esophagitis: Secondary | ICD-10-CM

## 2019-01-17 DIAGNOSIS — G992 Myelopathy in diseases classified elsewhere: Secondary | ICD-10-CM

## 2019-01-17 DIAGNOSIS — M549 Dorsalgia, unspecified: Secondary | ICD-10-CM

## 2019-01-17 DIAGNOSIS — J45909 Unspecified asthma, uncomplicated: Secondary | ICD-10-CM

## 2019-01-17 DIAGNOSIS — G959 Disease of spinal cord, unspecified: Secondary | ICD-10-CM

## 2019-01-17 DIAGNOSIS — G9520 Unspecified cord compression: Secondary | ICD-10-CM

## 2019-01-17 DIAGNOSIS — E162 Hypoglycemia, unspecified: Secondary | ICD-10-CM

## 2019-01-17 DIAGNOSIS — B009 Herpesviral infection, unspecified: Secondary | ICD-10-CM

## 2019-01-17 DIAGNOSIS — N83209 Unspecified ovarian cyst, unspecified side: Secondary | ICD-10-CM

## 2019-01-17 NOTE — Progress Notes
Date of Service: 01/17/2019     Subjective:               History of Present Illness    Diamond Morton is a 43 y.o. female.  Who presents to the neurosurgery clinic for follow-up appointment status post C3-4 ACDF.  Patient endorses resolution of her left-sided paresthesia with only mild paresthesia in the right upper extremity. she denies any weakness.  Cervical x-rays from today demonstrate ACDF implant is intact and in place with no evidence of  Pseudoarthrosis.     Review of Systems   Constitutional: Positive for appetite change.   HENT: Negative.    Eyes: Negative.    Respiratory: Negative.    Cardiovascular: Negative.    Gastrointestinal: Positive for diarrhea.   Endocrine: Negative.    Genitourinary: Negative.    Musculoskeletal: Positive for back pain, neck pain and neck stiffness.   Skin: Negative.    Allergic/Immunologic: Positive for environmental allergies.   Neurological: Positive for numbness and headaches.   Hematological: Negative.    Psychiatric/Behavioral: Positive for decreased concentration. The patient is nervous/anxious.          Objective:         ??? acetaminophen (TYLENOL) 325 mg tablet Take two tablets by mouth every 4 hours as needed.   ??? diphenhydrAMINE (BENADRYL) 50 mg capsule Take 50 mg by mouth every 6 hours as needed.   ??? duloxetine DR (CYMBALTA) 30 mg capsule Take 30 mg by mouth daily.   ??? ferrous sulfate (FEOSOL, FEROSUL) 325 mg (65 mg iron) tablet Take 325 mg by mouth every morning.   ??? loratadine (CLARITIN) 10 mg tablet Take 20 mg by mouth daily. Indications: allergies   ??? LORazepam (ATIVAN) 0.5 mg tablet Take 1 tablet by mouth every 6 hours as needed for Other....   ??? losartan(+) (COZAAR) 100 mg tablet Take 100 mg by mouth every morning.   ??? methocarbamoL (ROBAXIN) 750 mg tablet Take one tablet by mouth every 8 hours as needed.   ??? oxyCODONE (ROXICODONE) 5 mg tablet Take one tablet by mouth every 6 hours as needed ??? pantoprazole DR (PROTONIX) 40 mg tablet Take 40 mg by mouth daily.   ??? senna/docusate (SENOKOT-S) 8.6/50 mg tablet Take one tablet by mouth twice daily. Take while taking narcotics to avoid constipation.     Vitals:    01/17/19 1440   BP: 130/81   Pulse: 88   SpO2: 100%   Weight: 80.7 kg (178 lb)   Height: 170.2 cm (67)   PainSc: Four     Body mass index is 27.88 kg/m???.       Physical Exam  Ortho Exam    Constitutional: Well-developed, well nourished  HENT: Normocephalic, atraumatic, Oropharynx clear and moist  Eyes: Pupils equal round and reactive to light, Extraocular muscles normal  Cardiovascular: Heart sounds normal  Pulmonary: Effort normal  Abdominal: No tenderness  Musculoskeletal: ROM normal  Skin: Warm, dry  Neurological: A&Ox3, speech normal  CN: Facial sensation equal, Smile equal, eye closure equal, hearing present bilaterally, palate rises  bilaterally, shoulder shrug equal, tongue protrusion midline  5/5 strength x 4 extremities  Soft touch present x 4 extremities     Assessment and Plan:  43 year old female status post C3-4 ACDF,.  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  Patient was advised to utilize her outpatient rehab to improve with her symptom control.  We will plan to follow to see the patient in 2 months time for reassessment  Oswestry Total Score:: 26              I personally supervised the resident performing the E/M, I examined the patient, discussed case with resident and patient, and concur with resident documentation of history, physical assessment and treatment plan unless otherwise noted.

## 2019-02-01 ENCOUNTER — Encounter: Admit: 2019-02-01 | Discharge: 2019-02-01

## 2019-02-01 ENCOUNTER — Ambulatory Visit: Admit: 2019-02-01 | Discharge: 2019-02-01

## 2019-02-01 DIAGNOSIS — F329 Major depressive disorder, single episode, unspecified: Secondary | ICD-10-CM

## 2019-02-01 DIAGNOSIS — K219 Gastro-esophageal reflux disease without esophagitis: Secondary | ICD-10-CM

## 2019-02-01 DIAGNOSIS — M2550 Pain in unspecified joint: Secondary | ICD-10-CM

## 2019-02-01 DIAGNOSIS — N83209 Unspecified ovarian cyst, unspecified side: Secondary | ICD-10-CM

## 2019-02-01 DIAGNOSIS — E559 Vitamin D deficiency, unspecified: Secondary | ICD-10-CM

## 2019-02-01 DIAGNOSIS — M1990 Unspecified osteoarthritis, unspecified site: Secondary | ICD-10-CM

## 2019-02-01 DIAGNOSIS — R200 Anesthesia of skin: Secondary | ICD-10-CM

## 2019-02-01 DIAGNOSIS — I10 Essential (primary) hypertension: Secondary | ICD-10-CM

## 2019-02-01 DIAGNOSIS — F319 Bipolar disorder, unspecified: Secondary | ICD-10-CM

## 2019-02-01 DIAGNOSIS — R768 Other specified abnormal immunological findings in serum: Principal | ICD-10-CM

## 2019-02-01 DIAGNOSIS — E162 Hypoglycemia, unspecified: Secondary | ICD-10-CM

## 2019-02-01 DIAGNOSIS — D649 Anemia, unspecified: Secondary | ICD-10-CM

## 2019-02-01 DIAGNOSIS — J45909 Unspecified asthma, uncomplicated: Secondary | ICD-10-CM

## 2019-02-01 DIAGNOSIS — R4020 Unspecified coma: Secondary | ICD-10-CM

## 2019-02-01 DIAGNOSIS — B009 Herpesviral infection, unspecified: Secondary | ICD-10-CM

## 2019-02-01 DIAGNOSIS — F419 Anxiety disorder, unspecified: Secondary | ICD-10-CM

## 2019-02-01 DIAGNOSIS — M549 Dorsalgia, unspecified: Secondary | ICD-10-CM

## 2019-02-01 LAB — COMPREHENSIVE METABOLIC PANEL
Lab: 0.3 mg/dL (ref 0.3–1.2)
Lab: 0.6 mg/dL (ref 0.4–1.00)
Lab: 140 MMOL/L (ref 137–147)
Lab: 28 MMOL/L (ref 21–30)
Lab: 28 U/L (ref 7–56)
Lab: 4 MMOL/L (ref 3.5–5.1)
Lab: 4.1 g/dL (ref 3.5–5.0)
Lab: 5 mg/dL — ABNORMAL LOW (ref 7–25)
Lab: 6 10*3/uL (ref 3–12)
Lab: 7 g/dL (ref 6.0–8.0)
Lab: 87 mg/dL (ref 70–100)
Lab: 9.2 mg/dL (ref 8.5–10.6)
Lab: >60 mL/min (ref >60)
Lab: >60 mL/min (ref >60)

## 2019-02-01 LAB — RHEUMATOID FACTOR (RF): Lab: 34 [IU]/mL — ABNORMAL HIGH (ref <25)

## 2019-02-01 LAB — CBC AND DIFF
Lab: 0 10*3/uL (ref 0–0.20)
Lab: 4.4 M/UL (ref 4.0–5.0)
Lab: 8.9 10*3/uL (ref 4.5–11.0)

## 2019-02-01 LAB — C REACTIVE PROTEIN (CRP): Lab: 0.2 mg/dL (ref <1.0)

## 2019-02-01 LAB — SED RATE: Lab: 5 mm/h — ABNORMAL LOW (ref 0–20)

## 2019-02-01 NOTE — Progress Notes
Date of Service: 02/01/2019    Identification:  Diamond Morton is a 43 y/o divorced [place]  Her PCP is Ms. Melissa Huntington, an [place] [facility].  A still incomplete referral to the [facility] was initiated 08-28-18 by Ms. Huntington.  The patient was checked in for her 1:00 PM appointment at 1:13 PM.    Chief Complaint:  Rheumatoid factor positive    History of Present Illness  The RF was probably done February 2020 thinks patient.  Value?  Done because the patient was having problems with hands having no feelings and being stiff.  Starting about 2 months earlier.  On her phone she was able to retrieve the lab done 08-24-18 that showed a positive RF (not quantitated), CCP IgG/IgA < 16, ANA = 1:80 (speckled).    She has had joint pain in her wrists and neck and back.    Joint swelling - no  Joint redness - no  Joint warmth - no    Musculoskeletal stiffness stiffness was worse in the AMs for ca. 30-45 minutes.    She hasn't previously seen a Rheumatologist.    She hasn't had any x-rays of musculoskeletal areas that hurt.    No other arthritis-related blood tests since December 2019.    Prior Self Therapy:  no    Prior Prescription Therapy:  1) Robaxin 750 mg-sized tablets taken up to 4 times a day; tolerated; not very helpful, 2) meloxicam; size?  Tolerated; not much helpful.  3) diclofenac; size?  Tolerated; not helpful.    No neurologist.    Review of Systems  HENT: Positive for dental problem and tinnitus.    Endocrine: Positive for polydipsia.   Musculoskeletal: Positive for arthralgias, back pain, myalgias, neck pain and neck stiffness.   Neurological: Positive for dizziness, light-headedness, numbness and headaches.   Hematological: Bruises/bleeds easily.   Psychiatric/Behavioral: Positive for dysphoric mood. The patient is nervous/anxious.    All other systems reviewed and are negative.    Medical History:   Diagnosis Date   ??? Anemia oral iron supplement   ??? Anxiety disorder    ??? Arthritis    ??? Asthma     Reactive asthma per patient, inhaler and nebulizer   ??? Back pain    ??? Bipolar affective disorder (HCC)    ??? Coma (HCC) 1985    d/t PCN reaction   ??? Depression    ??? GERD (gastroesophageal reflux disease)     protonix   ??? Herpes simplex    ??? Hypertension    ??? Hypoglycemia    ??? Ovarian cyst 01/30/1991    resolved with surgery   43 y/o - coma; surgery for tonsillitis  43 y/o - Rt. ovarian cyst removal  G4 P2 Ab2spont LC2  43 y/o - cholecystectomy  43 y/o - lumbar laminectomy-discectomy  43 y/o - anterior cervical discectomy    Surgical History:   Procedure Laterality Date   ??? DILATION AND CURETTAGE  2011   ??? CHOLECYSTECTOMY  2013     Family History   Problem Relation Age of Onset   ??? Aneurysm Father    ??? Heart problem Father    ??? Hypertension Father    ??? Arthritis Sister    ??? Hypertension Brother    ??? Back pain Brother    ??? Back pain Daughter    ??? Scoliosis Daughter    Arthritis - Mother    Social History   ???  Marital status: Divorced   ??? Number of children: 2   ??? Years of education: HS + trade school   Occupational History   ??? Occupation: [employer]   Tobacco Use   ??? Smoking status: Former Smoker; up to 1 ppd for a good 20 years     Types: Cigarettes     Last attempt to quit: 12/21/2016     Years since quitting: 2.1   ??? Smokeless tobacco: Former Neurosurgeon     Types: Chew     Quit date: 07/13/1997   Substance and Sexual Activity   ??? Alcohol use: Yes     Alcohol/week: 0.0 standard drinks     Comment: occassional- per month, maybe 15 drinks   ??? Drug use: No     Medications       ??? acetaminophen (TYLENOL) 500 mg tablet Take 1,000 mg by mouth daily as needed for Pain. Max of 4,000 mg of acetaminophen in 24 hours.   ??? albuterol sulfate (PROAIR HFA) 90 mcg/actuation aerosol inhaler Inhale 2 puffs by mouth into the lungs five times daily as needed.   ??? diphenhydrAMINE (BENADRYL) 50 mg capsule Take 100 mg by mouth daily as needed. ??? duloxetine DR (CYMBALTA) 30 mg capsule Take 30 mg by mouth daily.   ??? ferrous sulfate (FEOSOL, FEROSUL) 325 mg (65 mg iron) tablet Take 325 mg by mouth every morning.   ??? loratadine (CLARITIN) 10 mg tablet Take 20 mg by mouth daily. Indications: allergies   ??? LORazepam (ATIVAN) 0.5 mg tablet Take 1 tablet by mouth every 6 hours as needed for Other....   ??? losartan(+) (COZAAR) 100 mg tablet Take 100 mg by mouth every morning.   ??? methocarbamoL (ROBAXIN) 750 mg tablet Take one tablet by mouth every 8 hours as needed. (Patient taking differently: Take 750 mg by mouth four times daily as needed.)   ??? pantoprazole DR (PROTONIX) 40 mg tablet Take 40 mg by mouth daily.     Allergies   Allergen Reactions   ??? Fentanyl SEE COMMENTS     Numbness in legs when got IV dose   ??? Onion ANAPHYLAXIS   ??? Pcn [Penicillins] SEE COMMENTS     COMA.   ??? Ancef [Cefazolin] RASH   ??? Sulfa (Sulfonamide Antibiotics) HIVES     Vitals:    02/01/19 1308   BP: 137/85   BP Source: Arm, Left Upper   Patient Position: Sitting   Pulse: 82   Resp: 16   Temp: 36.6 ???C (97.9 ???F)   TempSrc: Oral   SpO2: 100%   Weight: 79.6 kg (175 lb 6.4 oz)   Height: 170.2 cm (67)   PainSc: Four     Body mass index is 27.47 kg/m???.     Physical Exam  Vitals signs reviewed.   Constitutional:       General: She is awake. She is not in acute distress.     Appearance: Normal appearance. She is well-developed, well-groomed and normal weight. She is not ill-appearing, toxic-appearing or diaphoretic.      Interventions: She is not intubated.  HENT:      Head: Normocephalic and atraumatic. No raccoon eyes, Battle's sign, abrasion, contusion, masses, right periorbital erythema, left periorbital erythema or laceration. Hair is normal.      Jaw: No trismus.      Right Ear: Hearing and external ear normal. No decreased hearing noted. No laceration, drainage or swelling.      Left Ear: Hearing and external ear  normal. No decreased hearing noted. No laceration, drainage or swelling. Nose: Nose normal. No nasal deformity, laceration, congestion or rhinorrhea.      Right Nostril: No epistaxis.      Left Nostril: No epistaxis.      Mouth/Throat:      Lips: Pink. No lesions.      Mouth: No angioedema.      Dentition: No dental abscesses.   Eyes:      General: Lids are normal. Gaze aligned appropriately. No allergic shiner or scleral icterus.        Right eye: No foreign body or discharge.         Left eye: No foreign body or discharge.      Extraocular Movements: Extraocular movements intact.      Right eye: Normal extraocular motion and no nystagmus.      Left eye: Normal extraocular motion and no nystagmus.      Conjunctiva/sclera: Conjunctivae normal.      Right eye: Right conjunctiva is not injected. No chemosis, exudate or hemorrhage.     Left eye: Left conjunctiva is not injected. No chemosis, exudate or hemorrhage.  Neck:      Musculoskeletal: Normal range of motion. No neck rigidity or torticollis.      Trachea: Phonation normal.   Cardiovascular:      Rate and Rhythm: Normal rate and regular rhythm.      Heart sounds: Normal heart sounds, S1 normal and S2 normal. Heart sounds not distant. No murmur. No systolic murmur. No diastolic murmur. No friction rub. No gallop. No S3 or S4 sounds.    Pulmonary:      Effort: Pulmonary effort is normal. No tachypnea, bradypnea, accessory muscle usage, prolonged expiration, respiratory distress or retractions. She is not intubated.      Breath sounds: Normal breath sounds and air entry. No stridor, decreased air movement or transmitted upper airway sounds. No decreased breath sounds, wheezing, rhonchi or rales.   Chest:      Chest wall: No deformity.   Abdominal:      General: There is no distension.      Tenderness: There is no guarding.   Musculoskeletal: Normal range of motion.         General: No swelling, deformity or signs of injury.      Right lower leg: No edema.      Left lower leg: No edema. Comments: No deformity.  Joint range of motion normal.  No joints red, warm, or swollen.   Skin:     General: Skin is warm and dry.      Coloration: Skin is not ashen, cyanotic, jaundiced, mottled, pale or sallow.      Findings: No abrasion, abscess, acne, bruising, burn, ecchymosis, erythema, signs of injury, laceration, lesion, petechiae, rash or wound. Rash is not crusting, macular, nodular, papular, purpuric, pustular, scaling, urticarial or vesicular.      Nails: There is no clubbing.     Neurological:      General: No focal deficit present.      Mental Status: She is alert and oriented to person, place, and time.      Cranial Nerves: Cranial nerves are intact. No cranial nerve deficit, dysarthria or facial asymmetry.      Motor: No tremor or seizure activity.      Coordination: Coordination is intact. Coordination normal.      Gait: Gait is intact.   Psychiatric:         Attention and  Perception: Attention and perception normal. She is attentive. She does not perceive auditory or visual hallucinations.         Mood and Affect: Mood normal. Mood is not anxious, depressed or elated. Affect is not labile, blunt, flat, angry, tearful or inappropriate.         Speech: Speech normal. She is communicative. Speech is not rapid and pressured, delayed, slurred or tangential.         Behavior: Behavior normal. Behavior is not agitated, slowed, aggressive, withdrawn, hyperactive or combative. Behavior is cooperative.         Thought Content: Thought content normal. Thought content is not paranoid or delusional. Thought content does not include homicidal or suicidal ideation. Thought content does not include homicidal or suicidal plan.         Cognition and Memory: Cognition and memory normal. Cognition is not impaired. Memory is not impaired. She does not exhibit impaired recent memory or impaired remote memory.         Judgment: Judgment normal. Judgment is not impulsive or inappropriate.       Assessment and Plan: We need to repeat the RF and ANA to see a) are they still positive and b) what was the value of RF.  I need digging deeper blood tests.  And some x-rays.  Then I'll see her back as soon as she can get back and we'll see what's going on and is there anything more that can be done to help her feel better that hasn't already been tried.    Orders Placed This Encounter   ??? JOINT SRVY 1 VW 2+ JOINTS   ??? RHEUMATOID FACTOR (RF)  today   ??? ANTI-NUCLEAR ANTIBODY(ANA) today   ??? ANTI SMITH(SM) ANTI RNP AB today   ??? ANTI SSA ANTI SSB AB today   ??? ANTI-DNA DOUBLE STRAND today   ??? COMPLEMENT, TOTAL (CH50) today   ??? SED RATE today   ??? C REACTIVE PROTEIN (CRP) today   ??? CBC AND DIFF  today   ??? COMPREHENSIVE METABOLIC PANEL  today   ??? 25-OH VITAMIN D (D2 + D3)  today     Return to clinic in a week or two or three or whenever.    Alphia Kava, M.D.  Assistant Professor of Medicine and Pediatrics  Division of Allergy, Immunology and Rheumatology  Department of Medicine  [facility]  [address]  [place], [place] [address]

## 2019-02-01 NOTE — Patient Instructions
Allergy, Immunology and Rheumatology    [facility], [place]  [address]  [address]  [place], [place] [address]    02/01/2019    Patient:  Diamond Morton  Med Rec #:  [id number]  DOB:  [birth year]  Visit date:  02/01/2019    Dear Dr. Alona Bene,     It is a pleasure caring for Diamond Morton. Below is the information pertaining to today's visit:     Date of Service: 02/01/2019    Identification:  Diamond Morton is a 43 y/o divorced white female from [place].  Her PCP is Ms. Diamond Morton, an [place], [place].  A still incomplete referral to the [facility] was initiated 08-28-18 by Ms. Morton.  The patient was checked in for her 1:00 PM appointment at 1:13 PM.    Chief Complaint:  Rheumatoid factor positive    History of Present Illness  The RF was probably done February 2020 thinks patient.  Value?  Done because the patient was having problems with hands having no feelings and being stiff.  Starting about 2 months earlier.  On her phone she was able to retrieve the lab done 08-24-18 that showed a positive RF (not quantitated), CCP IgG/IgA < 16, ANA = 1:80 (speckled).    She has had joint pain in her wrists and neck and back.    Joint swelling - no  Joint redness - no  Joint warmth - no    Musculoskeletal stiffness stiffness was worse in the AMs for ca. 30-45 minutes.    She hasn't previously seen a Rheumatologist.    She hasn't had any x-rays of musculoskeletal areas that hurt.    No other arthritis-related blood tests since December 2019.    Prior Self Therapy:  no    Prior Prescription Therapy:  1) Robaxin 750 mg-sized tablets taken up to 4 times a day; tolerated; not very helpful, 2) meloxicam; size?  Tolerated; not much helpful.  3) diclofenac; size?  Tolerated; not helpful.    No neurologist.    Review of Systems  HENT: Positive for dental problem and tinnitus.    Endocrine: Positive for polydipsia. Musculoskeletal: Positive for arthralgias, back pain, myalgias, neck pain and neck stiffness.   Neurological: Positive for dizziness, light-headedness, numbness and headaches.   Hematological: Bruises/bleeds easily.   Psychiatric/Behavioral: Positive for dysphoric mood. The patient is nervous/anxious.    All other systems reviewed and are negative.    Medical History:   Diagnosis Date   ??? Anemia     oral iron supplement   ??? Anxiety disorder    ??? Arthritis    ??? Asthma     Reactive asthma per patient, inhaler and nebulizer   ??? Back pain    ??? Bipolar affective disorder (HCC)    ??? Coma (HCC) 1985    d/t PCN reaction   ??? Depression    ??? GERD (gastroesophageal reflux disease)     protonix   ??? Herpes simplex    ??? Hypertension    ??? Hypoglycemia    ??? Ovarian cyst 01/30/1991    resolved with surgery   43 y/o - coma; surgery for tonsillitis  43 y/o - Rt. ovarian cyst removal  G4 P2 Ab2spont LC2  43 y/o - cholecystectomy  43 y/o - lumbar laminectomy-discectomy  43 y/o - anterior cervical discectomy    Surgical History:   Procedure Laterality Date   ??? DILATION AND CURETTAGE  2011   ???  CHOLECYSTECTOMY  2013     Family History   Problem Relation Age of Onset   ??? Aneurysm Father    ??? Heart problem Father    ??? Hypertension Father    ??? Arthritis Sister    ??? Hypertension Brother    ??? Back pain Brother    ??? Back pain Daughter    ??? Scoliosis Daughter    Arthritis - Mother    Social History   ??? Marital status: Divorced   ??? Number of children: 2   ??? Years of education: HS + trade school   Occupational History   ??? Occupation: [employer]   Tobacco Use   ??? Smoking status: Former Smoker; up to 1 ppd for a good 20 years     Types: Cigarettes     Last attempt to quit: 12/21/2016     Years since quitting: 2.1   ??? Smokeless tobacco: Former Neurosurgeon     Types: Chew     Quit date: 07/13/1997   Substance and Sexual Activity   ??? Alcohol use: Yes     Alcohol/week: 0.0 standard drinks     Comment: occassional- per month, maybe 15 drinks ??? Drug use: No     Medications       ??? acetaminophen (TYLENOL) 500 mg tablet Take 1,000 mg by mouth daily as needed for Pain. Max of 4,000 mg of acetaminophen in 24 hours.   ??? albuterol sulfate (PROAIR HFA) 90 mcg/actuation aerosol inhaler Inhale 2 puffs by mouth into the lungs five times daily as needed.   ??? diphenhydrAMINE (BENADRYL) 50 mg capsule Take 100 mg by mouth daily as needed.   ??? duloxetine DR (CYMBALTA) 30 mg capsule Take 30 mg by mouth daily.   ??? ferrous sulfate (FEOSOL, FEROSUL) 325 mg (65 mg iron) tablet Take 325 mg by mouth every morning.   ??? loratadine (CLARITIN) 10 mg tablet Take 20 mg by mouth daily. Indications: allergies   ??? LORazepam (ATIVAN) 0.5 mg tablet Take 1 tablet by mouth every 6 hours as needed for Other....   ??? losartan(+) (COZAAR) 100 mg tablet Take 100 mg by mouth every morning.   ??? methocarbamoL (ROBAXIN) 750 mg tablet Take one tablet by mouth every 8 hours as needed. (Patient taking differently: Take 750 mg by mouth four times daily as needed.)   ??? pantoprazole DR (PROTONIX) 40 mg tablet Take 40 mg by mouth daily.     Allergies   Allergen Reactions   ??? Fentanyl SEE COMMENTS     Numbness in legs when got IV dose   ??? Onion ANAPHYLAXIS   ??? Pcn [Penicillins] SEE COMMENTS     COMA.   ??? Ancef [Cefazolin] RASH   ??? Sulfa (Sulfonamide Antibiotics) HIVES     Vitals:    02/01/19 1308   BP: 137/85   BP Source: Arm, Left Upper   Patient Position: Sitting   Pulse: 82   Resp: 16   Temp: 36.6 ???C (97.9 ???F)   TempSrc: Oral   SpO2: 100%   Weight: 79.6 kg (175 lb 6.4 oz)   Height: 170.2 cm (67)   PainSc: Four     Body mass index is 27.47 kg/m???.     Physical Exam  Vitals signs reviewed.   Constitutional:       General: She is awake. She is not in acute distress.     Appearance: Normal appearance. She is well-developed, well-groomed and normal weight. She is not ill-appearing, toxic-appearing or diaphoretic.  Interventions: She is not intubated.  HENT: Head: Normocephalic and atraumatic. No raccoon eyes, Battle's sign, abrasion, contusion, masses, right periorbital erythema, left periorbital erythema or laceration. Hair is normal.      Jaw: No trismus.      Right Ear: Hearing and external ear normal. No decreased hearing noted. No laceration, drainage or swelling.      Left Ear: Hearing and external ear normal. No decreased hearing noted. No laceration, drainage or swelling.      Nose: Nose normal. No nasal deformity, laceration, congestion or rhinorrhea.      Right Nostril: No epistaxis.      Left Nostril: No epistaxis.      Mouth/Throat:      Lips: Pink. No lesions.      Mouth: No angioedema.      Dentition: No dental abscesses.   Eyes:      General: Lids are normal. Gaze aligned appropriately. No allergic shiner or scleral icterus.        Right eye: No foreign body or discharge.         Left eye: No foreign body or discharge.      Extraocular Movements: Extraocular movements intact.      Right eye: Normal extraocular motion and no nystagmus.      Left eye: Normal extraocular motion and no nystagmus.      Conjunctiva/sclera: Conjunctivae normal.      Right eye: Right conjunctiva is not injected. No chemosis, exudate or hemorrhage.     Left eye: Left conjunctiva is not injected. No chemosis, exudate or hemorrhage.  Neck:      Musculoskeletal: Normal range of motion. No neck rigidity or torticollis.      Trachea: Phonation normal.   Cardiovascular:      Rate and Rhythm: Normal rate and regular rhythm.      Heart sounds: Normal heart sounds, S1 normal and S2 normal. Heart sounds not distant. No murmur. No systolic murmur. No diastolic murmur. No friction rub. No gallop. No S3 or S4 sounds.    Pulmonary:      Effort: Pulmonary effort is normal. No tachypnea, bradypnea, accessory muscle usage, prolonged expiration, respiratory distress or retractions. She is not intubated.      Breath sounds: Normal breath sounds and air entry. No stridor, decreased air movement or transmitted upper airway sounds. No decreased breath sounds, wheezing, rhonchi or rales.   Chest:      Chest wall: No deformity.   Abdominal:      General: There is no distension.      Tenderness: There is no guarding.   Musculoskeletal: Normal range of motion.         General: No swelling, deformity or signs of injury.      Right lower leg: No edema.      Left lower leg: No edema.      Comments: No deformity.  Joint range of motion normal.  No joints red, warm, or swollen.   Skin:     General: Skin is warm and dry.      Coloration: Skin is not ashen, cyanotic, jaundiced, mottled, pale or sallow.      Findings: No abrasion, abscess, acne, bruising, burn, ecchymosis, erythema, signs of injury, laceration, lesion, petechiae, rash or wound. Rash is not crusting, macular, nodular, papular, purpuric, pustular, scaling, urticarial or vesicular.      Nails: There is no clubbing.  Neurological:      General: No focal deficit present.  Mental Status: She is alert and oriented to person, place, and time.      Cranial Nerves: Cranial nerves are intact. No cranial nerve deficit, dysarthria or facial asymmetry.      Motor: No tremor or seizure activity.      Coordination: Coordination is intact. Coordination normal.      Gait: Gait is intact.   Psychiatric:         Attention and Perception: Attention and perception normal. She is attentive. She does not perceive auditory or visual hallucinations.         Mood and Affect: Mood normal. Mood is not anxious, depressed or elated. Affect is not labile, blunt, flat, angry, tearful or inappropriate.         Speech: Speech normal. She is communicative. Speech is not rapid and pressured, delayed, slurred or tangential.         Behavior: Behavior normal. Behavior is not agitated, slowed, aggressive, withdrawn, hyperactive or combative. Behavior is cooperative.         Thought Content: Thought content normal. Thought content is not paranoid or delusional. Thought content does not include homicidal or suicidal ideation. Thought content does not include homicidal or suicidal plan.         Cognition and Memory: Cognition and memory normal. Cognition is not impaired. Memory is not impaired. She does not exhibit impaired recent memory or impaired remote memory.         Judgment: Judgment normal. Judgment is not impulsive or inappropriate.     Assessment and Plan:  We need to repeat the RF and ANA to see a) are they still positive and b) what was the value of RF.  I need digging deeper blood tests.  And some x-rays.  Then I'll see her back as soon as she can get back and we'll see what's going on and is there anything more that can be done to help her feel better that hasn't already been tried.    Orders Placed This Encounter   ??? JOINT SRVY 1 VW 2+ JOINTS   ??? RHEUMATOID FACTOR (RF)  today   ??? ANTI-NUCLEAR ANTIBODY(ANA) today   ??? ANTI SMITH(SM) ANTI RNP AB today   ??? ANTI SSA ANTI SSB AB today   ??? ANTI-DNA DOUBLE STRAND today   ??? COMPLEMENT, TOTAL (CH50) today   ??? SED RATE today   ??? C REACTIVE PROTEIN (CRP) today   ??? CBC AND DIFF  today   ??? COMPREHENSIVE METABOLIC PANEL  today   ??? 25-OH VITAMIN D (D2 + D3)  today     Return to clinic in a week or two or three or whenever.    Alphia Kava, M.D.  Assistant Professor of Medicine and Pediatrics  Division of Allergy, Immunology and Rheumatology  Department of Medicine  [facility]  [address]  [place], [place] [address]

## 2019-02-02 ENCOUNTER — Encounter: Admit: 2019-02-02 | Discharge: 2019-02-02

## 2019-02-02 LAB — ANTI-NUCLEAR AB(ANA)-QUANT: Lab: 80 — ABNORMAL HIGH (ref <80)

## 2019-02-02 LAB — 25-OH VITAMIN D (D2 + D3): Lab: 22 ng/mL — ABNORMAL LOW (ref 30–80)

## 2019-02-02 LAB — ANTI-DNA DOUBLE STRAND: Lab: <10 {titer} — ABNORMAL LOW (ref <10)

## 2019-02-02 LAB — ANTI SMITH(SM) ANTI RNP AB

## 2019-02-02 LAB — ANTI SSA ANTI SSB AB

## 2019-02-02 LAB — ANTI-NUCLEAR ANTIBODY(ANA)

## 2019-02-07 LAB — COMPLEMENT, TOTAL (CH50): Lab: 76 U/mL — ABNORMAL HIGH (ref 41–95)

## 2019-03-07 ENCOUNTER — Encounter: Admit: 2019-03-07 | Discharge: 2019-03-07 | Payer: MEDICAID

## 2019-03-07 DIAGNOSIS — Z981 Arthrodesis status: Secondary | ICD-10-CM

## 2019-03-07 DIAGNOSIS — M4802 Spinal stenosis, cervical region: Secondary | ICD-10-CM

## 2019-03-14 ENCOUNTER — Ambulatory Visit: Admit: 2019-03-14 | Discharge: 2019-03-14 | Payer: MEDICAID

## 2019-03-14 ENCOUNTER — Encounter: Admit: 2019-03-14 | Discharge: 2019-03-14 | Payer: MEDICAID

## 2019-03-14 DIAGNOSIS — M549 Dorsalgia, unspecified: Secondary | ICD-10-CM

## 2019-03-14 DIAGNOSIS — N83209 Unspecified ovarian cyst, unspecified side: Secondary | ICD-10-CM

## 2019-03-14 DIAGNOSIS — G9520 Unspecified cord compression: Secondary | ICD-10-CM

## 2019-03-14 DIAGNOSIS — J45909 Unspecified asthma, uncomplicated: Secondary | ICD-10-CM

## 2019-03-14 DIAGNOSIS — E162 Hypoglycemia, unspecified: Secondary | ICD-10-CM

## 2019-03-14 DIAGNOSIS — R4020 Unspecified coma: Secondary | ICD-10-CM

## 2019-03-14 DIAGNOSIS — B009 Herpesviral infection, unspecified: Secondary | ICD-10-CM

## 2019-03-14 DIAGNOSIS — I10 Essential (primary) hypertension: Secondary | ICD-10-CM

## 2019-03-14 DIAGNOSIS — F419 Anxiety disorder, unspecified: Secondary | ICD-10-CM

## 2019-03-14 DIAGNOSIS — M4802 Spinal stenosis, cervical region: Secondary | ICD-10-CM

## 2019-03-14 DIAGNOSIS — M1990 Unspecified osteoarthritis, unspecified site: Secondary | ICD-10-CM

## 2019-03-14 DIAGNOSIS — Z981 Arthrodesis status: Secondary | ICD-10-CM

## 2019-03-14 DIAGNOSIS — D649 Anemia, unspecified: Secondary | ICD-10-CM

## 2019-03-14 DIAGNOSIS — F319 Bipolar disorder, unspecified: Secondary | ICD-10-CM

## 2019-03-14 DIAGNOSIS — K219 Gastro-esophageal reflux disease without esophagitis: Secondary | ICD-10-CM

## 2019-03-14 DIAGNOSIS — F329 Major depressive disorder, single episode, unspecified: Secondary | ICD-10-CM

## 2019-03-14 DIAGNOSIS — G959 Disease of spinal cord, unspecified: Secondary | ICD-10-CM

## 2019-03-14 NOTE — Progress Notes
Date of Service: 03/14/2019     Subjective:               History of Present Illness    Diamond Morton is a 43 y.o. female.  She presents to clinic today for a 74-month follow-up of C3-4 ACDF on July 14.  She states that she had been recovering well with resolution of her upper extremity numbness.  However, on Friday she hit her head and jamming her neck on the basement ceiling.  She states that she felt immediate pain.  Her pain went from 3/10 prior to hitting her head to 7/10 currently.  Pain is sharp in nature in her posterior neck.  Does not radiate to her arms.  She states that she was having some right arm weakness and difficulty opening pill bottle yesterday.  She denies lower extremity weakness, numbness/tingling, and bowel bladder incontinence.       Review of Systems   Constitutional: Positive for fatigue.   HENT: Negative.    Eyes: Negative.    Respiratory: Negative.    Cardiovascular: Negative.    Gastrointestinal: Negative.    Endocrine: Negative.    Genitourinary: Negative.    Musculoskeletal: Positive for neck pain and neck stiffness.   Skin: Negative.    Allergic/Immunologic: Negative.    Neurological: Positive for dizziness and headaches.   Hematological: Negative.    Psychiatric/Behavioral: Positive for sleep disturbance.         Objective:         ? acetaminophen (TYLENOL) 500 mg tablet Take 1,000 mg by mouth daily as needed for Pain. Max of 4,000 mg of acetaminophen in 24 hours.   ? albuterol sulfate (PROAIR HFA) 90 mcg/actuation aerosol inhaler Inhale 2 puffs by mouth into the lungs five times daily as needed.   ? diphenhydrAMINE (BENADRYL) 50 mg capsule Take 100 mg by mouth daily as needed.   ? duloxetine DR (CYMBALTA) 30 mg capsule Take 30 mg by mouth daily.   ? ferrous sulfate (FEOSOL, FEROSUL) 325 mg (65 mg iron) tablet Take 325 mg by mouth every morning.   ? loratadine (CLARITIN) 10 mg tablet Take 20 mg by mouth daily. Indications: allergies ? LORazepam (ATIVAN) 0.5 mg tablet Take 1 tablet by mouth every 6 hours as needed for Other....   ? losartan(+) (COZAAR) 100 mg tablet Take 100 mg by mouth every morning.   ? methocarbamoL (ROBAXIN) 750 mg tablet Take one tablet by mouth every 8 hours as needed. (Patient taking differently: Take 750 mg by mouth four times daily as needed.)   ? pantoprazole DR (PROTONIX) 40 mg tablet Take 40 mg by mouth daily.     Vitals:    03/14/19 1306   BP: 135/83   Pulse: 72   Temp: 37 ?C (98.6 ?F)   SpO2: 100%   Weight: 79.4 kg (175 lb)   Height: 170.2 cm (67)   PainSc: Seven     Body mass index is 27.41 kg/m?Marland Kitchen       Physical Exam   Neurologic Exam:  Mental status: awake and alert, appropriate affect  Motor: no pronator drift, motor strength 5/5 at hand grips, and on flexion and extension at the biceps, triceps, shoulders, hips, knees and ankles.  Sensory: decreased sensation to light touch over the right deltoid,otherwise intact to light touch  Reflexes: deep tendon reflexes are 2+ and equal bilaterally at the biceps, brachioradialis, patellar, and Achilles. No ankle clonus. Positive Hoffman's in the RUE.  Incision:  completely  healed       Assessment and Plan:  Diamond Morton is recovering well from her ACDF on 7/14.  Her flexion-extension C-spine x-rays obtained today show no abnormal motion with ACDF instrumentation intact.  Her acute neck pain from hitting her head likely resolve with conservative therapy.  She may follow-up in clinic as needed.      Oswestry Total Score:: 26                I personally supervised the resident performing the E/M, I examined the patient, discussed case with resident and patient, and concur with resident documentation of history, physical assessment and treatment plan unless otherwise noted.

## 2019-06-21 ENCOUNTER — Encounter: Admit: 2019-06-21 | Discharge: 2019-06-21 | Payer: MEDICAID

## 2019-06-21 NOTE — Progress Notes
Reason for appointment Hyst consult  Available Records:   ? Appt notes  ? [place]  Records can be found in: Ready to scan file  Are additional records needed? No  Care Coordinator plan:   ? No change; Care Coordinator review complete    Future Appointments   Date Time Provider Department Center   07/24/2019 10:00 AM Alden Benjamin, MD [facility]

## 2019-07-24 ENCOUNTER — Encounter: Admit: 2019-07-24 | Discharge: 2019-07-24 | Payer: MEDICAID

## 2019-07-24 ENCOUNTER — Ambulatory Visit: Admit: 2019-07-24 | Discharge: 2019-07-25 | Payer: MEDICAID

## 2019-07-24 DIAGNOSIS — F319 Bipolar disorder, unspecified: Secondary | ICD-10-CM

## 2019-07-24 DIAGNOSIS — N930 Postcoital and contact bleeding: Secondary | ICD-10-CM

## 2019-07-24 DIAGNOSIS — E162 Hypoglycemia, unspecified: Secondary | ICD-10-CM

## 2019-07-24 DIAGNOSIS — E785 Hyperlipidemia, unspecified: Secondary | ICD-10-CM

## 2019-07-24 DIAGNOSIS — D649 Anemia, unspecified: Secondary | ICD-10-CM

## 2019-07-24 DIAGNOSIS — G43909 Migraine, unspecified, not intractable, without status migrainosus: Secondary | ICD-10-CM

## 2019-07-24 DIAGNOSIS — M1990 Unspecified osteoarthritis, unspecified site: Secondary | ICD-10-CM

## 2019-07-24 DIAGNOSIS — A749 Chlamydial infection, unspecified: Secondary | ICD-10-CM

## 2019-07-24 DIAGNOSIS — N83209 Unspecified ovarian cyst, unspecified side: Secondary | ICD-10-CM

## 2019-07-24 DIAGNOSIS — N941 Unspecified dyspareunia: Secondary | ICD-10-CM

## 2019-07-24 DIAGNOSIS — A599 Trichomoniasis, unspecified: Secondary | ICD-10-CM

## 2019-07-24 DIAGNOSIS — F419 Anxiety disorder, unspecified: Secondary | ICD-10-CM

## 2019-07-24 DIAGNOSIS — R102 Pelvic and perineal pain: Secondary | ICD-10-CM

## 2019-07-24 DIAGNOSIS — B977 Papillomavirus as the cause of diseases classified elsewhere: Secondary | ICD-10-CM

## 2019-07-24 DIAGNOSIS — M7918 Myalgia, other site: Secondary | ICD-10-CM

## 2019-07-24 DIAGNOSIS — N888 Other specified noninflammatory disorders of cervix uteri: Secondary | ICD-10-CM

## 2019-07-24 DIAGNOSIS — Z8041 Family history of malignant neoplasm of ovary: Secondary | ICD-10-CM

## 2019-07-24 DIAGNOSIS — I10 Essential (primary) hypertension: Secondary | ICD-10-CM

## 2019-07-24 DIAGNOSIS — F329 Major depressive disorder, single episode, unspecified: Secondary | ICD-10-CM

## 2019-07-24 DIAGNOSIS — M549 Dorsalgia, unspecified: Secondary | ICD-10-CM

## 2019-07-24 DIAGNOSIS — K219 Gastro-esophageal reflux disease without esophagitis: Secondary | ICD-10-CM

## 2019-07-24 DIAGNOSIS — J45909 Unspecified asthma, uncomplicated: Secondary | ICD-10-CM

## 2019-07-24 DIAGNOSIS — R4020 Unspecified coma: Secondary | ICD-10-CM

## 2019-07-24 NOTE — Progress Notes
Date of Service: 07/24/2019    Subjective:             Diamond Morton is a 44 y.o. female.    History of Present Illness  This patient is a 44 year old female who presents for evaluation of postcoital bleeding, pain associated with intercourse, and a strong family history of gynecologic malignancy, and recent finding of large number of nabothian cysts on imaging.  Patient states that she has been experiencing pain associated with intercourse for about the last 2 to 3 weeks.  She previously has not had any of these issues.  She does not recall any inciting event.  Her periods are regular and occur about every 28 to 30 days and last for 4 to 7 days and are generally not heavy or painful.  She has also recently within the last 6 months or so been experiencing pain after intercourse.  This is light spotting and is not associated with any pain.  She had imaging done which revealed a normal size uterus with bilateral 3 cm simple appearing ovarian cysts and innumerable nabothian cysts.  She had a Pap smear in October 2020 with HPV cotesting which was normal/negative.  She is very worried about the possibility of cancer as she has had multiple family members have had uterine and ovarian cancer.  She denies any concerns for sexually transmitted infections.  She denies any fevers or chills.  She denies any bowel or bladder problems.         Review of Systems   Constitutional: Positive for fatigue. Negative for fever and unexpected weight change.   HENT: Negative for voice change.    Respiratory: Negative for cough and shortness of breath.    Cardiovascular: Negative for chest pain and leg swelling.   Gastrointestinal: Positive for abdominal pain, diarrhea and nausea. Negative for blood in stool, constipation and vomiting.   Genitourinary: Positive for dyspareunia, enuresis and vaginal bleeding. Negative for difficulty urinating, dysuria, frequency, genital sores, hematuria, menstrual problem, pelvic pain, urgency, vaginal discharge and vaginal pain.   Musculoskeletal: Positive for back pain. Negative for arthralgias.   Skin: Negative for rash.   Allergic/Immunologic: Positive for food allergies.   Neurological: Positive for headaches. Negative for light-headedness.   Hematological: Negative for adenopathy. Bruises/bleeds easily.   Psychiatric/Behavioral: Negative for confusion. The patient is nervous/anxious.      Medical History:   Diagnosis Date   ? Anemia     oral iron supplement   ? Anxiety disorder    ? Arthritis    ? Asthma     Reactive asthma per patient, inhaler and nebulizer   ? Back pain    ? Bipolar affective disorder (HCC)    ? Chlamydia    ? Coma (HCC) 1985    d/t PCN reaction   ? Depression    ? GERD (gastroesophageal reflux disease)     protonix   ? HPV (human papilloma virus) infection    ? Hyperlipidemia    ? Hypertension    ? Hypoglycemia    ? Migraines    ? Ovarian cyst 01/30/1991    resolved with surgery   ? Trichomonas infection      Surgical History:   Procedure Laterality Date   ? TONSILLECTOMY  1985   ? CYST REMOVAL Right 1992    Ovarian Cyst   ? DILATION AND CURETTAGE  2011   ? CHOLECYSTECTOMY  2013   ? RIGHT LAMINECTOMY LUMBAR 4 TO SACRAL 1 WITH FORAMINOTOMIES Right  12/19/2015    Performed by Storm Frisk, MD at [facility]   ? CERVICAL 3-4 ANTERIOR DISCECTOMY AND FUSION N/A 12/05/2018    Performed by Pablo [place], MD at [facility] OR   ? INSERTION INTERBODY BIOMECHANICAL DEVICE WITH/ WITHOUT INTEGRAL ANTERIOR INSTRUMENTATION FOR DEVICE ANCHORING TO INTERVERTEBRAL DISC SPACE IN CONJUNCTION WITH INTERBODY ARTHRODESIS - EACH INTERSPACE N/A 12/05/2018    Performed by Pablo [place], MD at [facility] OR   ? ANTERIOR INSTRUMENTATION - 2 TO 3 VERTEBRAL SEGMENTS N/A 12/05/2018    Performed by Pablo Williamson, MD at [facility] OR   ? ALLOGRAFT/ MORSELIZED/ PLACEMENT OSTEOPROMOTIVE MATERIAL - SPINE SURGERY ONLY N/A 12/05/2018    Performed by Pablo [place], MD at [facility] OR     Family History   Problem Relation Age of Onset   ? Heart Disease Mother    ? Cancer-Ovarian Mother    ? Cancer-Uterine Mother    ? Aneurysm Father    ? Heart problem Father    ? Hypertension Father    ? Arthritis Sister    ? Hypertension Brother    ? Back pain Brother    ? Back pain Daughter    ? Scoliosis Daughter    ? Cancer-Ovarian Maternal Grandmother    ? Cancer-Uterine Maternal Grandmother    ? Cancer-Ovarian Other    ? Cancer-Uterine Other      Social History     Socioeconomic History   ? Marital status: Divorced     Spouse name: Not on file   ? Number of children: 2   ? Years of education: Not on file   ? Highest education level: Not on file   Occupational History   ? Occupation: EMPLOYED    Tobacco Use   ? Smoking status: Former Smoker     Packs/day: 2.00     Years: 27.00     Pack years: 54.00     Types: Cigarettes     Quit date: 12/21/2016     Years since quitting: 2.5   ? Smokeless tobacco: Former Neurosurgeon     Types: Chew     Quit date: 07/13/1997   Substance and Sexual Activity   ? Alcohol use: Yes     Alcohol/week: 0.0 standard drinks     Comment: occassional- per month, maybe 15 drinks   ? Drug use: No   ? Sexual activity: Yes     Partners: Male   Other Topics Concern   ? Not on file   Social History Narrative   ? Not on file           Objective:         ? acetaminophen (TYLENOL) 500 mg tablet Take 1,000 mg by mouth daily as needed for Pain. Max of 4,000 mg of acetaminophen in 24 hours.   ? albuterol sulfate (PROAIR HFA) 90 mcg/actuation aerosol inhaler Inhale 2 puffs by mouth into the lungs five times daily as needed.   ? diphenhydrAMINE (BENADRYL) 50 mg capsule Take 100 mg by mouth daily as needed.   ? duloxetine DR (CYMBALTA) 30 mg capsule Take 30 mg by mouth daily.   ? ferrous sulfate (FEOSOL, FEROSUL) 325 mg (65 mg iron) tablet Take 325 mg by mouth every morning.   ? HYDROcodone/acetaminophen (NORCO) 5/325 mg tablet TAKE 1 TO 2 TABLETS BY MOUTH EVERY 6 TO 8 HOURS AS NEEDED FOR SEVERE PAIN (SCORE 7 10)   ? loratadine (CLARITIN) 10 mg tablet Take 20 mg by mouth  daily. Indications: allergies   ? LORazepam (ATIVAN) 0.5 mg tablet Take 1 tablet by mouth every 6 hours as needed for Other....   ? losartan(+) (COZAAR) 100 mg tablet Take 100 mg by mouth every morning.   ? methocarbamoL (ROBAXIN) 750 mg tablet Take one tablet by mouth every 8 hours as needed. (Patient taking differently: Take 750 mg by mouth four times daily as needed.)   ? pantoprazole DR (PROTONIX) 40 mg tablet Take 40 mg by mouth daily.     Vitals:    07/24/19 1026   BP: 136/75   BP Source: Arm, Left Upper   Patient Position: Sitting   Pulse: 100   Temp: 36.8 ?C (98.3 ?F)   Weight: 85.6 kg (188 lb 12.8 oz)   PainSc: Zero     Body mass index is 29.57 kg/m?Marland Kitchen     Physical Exam  Vital signs reviewed.  CONSTITUTIONAL: Healthy appearing female.  Alert and oriented x 3, and in no acute distress.  EYES: No scleral icterus, EOMI.  ENT:  Normal range of motion.    CV:  Regular rate.  RESP:  Normal effort, unlabored breathing.  ABDOMEN:  Soft and non-distended.  No mass, rebound, or guarding.    PELVIC:  Normal appearing external female genitalia.  Normal vaginal epithelium without abnormal lesions or discharge. Normal appearing cervix with a large number of small mucous cyst present on the cervix and the tissue does appear friable. Uterus is small, mobile, and tender. No palpable adnexal masses.  No anoperineal lesions.  There is moderate diffuse pelvic floor muscle tenderness of the levator ani and obturator internus muscle groups.  SKIN:  Warm, dry.  No concerning lesions.  EXTREMITIES: No edema.  PSYCH: Appropriate mood and affect.    Pap/HPV Normal/Negative 02/2019.     Assessment and Plan:  Dyspareunia  Post-Coital Bleeding  Nabothian Cyts  Fam hx of cancer    There does appear to be a large number of nabothian cyst on the cervix.  We talked about different management strategies including consideration for LEEP procedure all the way up to hysterectomy if she desired to have this done.  I am unsure what the inciting factor is in the pain, but there is a fair amount of musculoskeletal pain as well as uterine pain present.  We did gonorrhea and chlamydia and trichomonas testing today although I suspect these will come back negative.  We talked about the possibility of referral for pelvic floor physical therapy and she is going to consider this option.  The postcoital bleeding seems related to friable cervix.  She had normal Pap with negative HPV testing and there is really no concern for malignancy in the setting.  Due to her strong family history of cancer that is causing significant concerns she does desire referral to genetic counseling as this would potentially help guide her decision making if she is to pursue surgery.  We will refer her for genetic counseling and have her follow-up with me after this has been done.  Advised she is over-the-counter medications and heat to help manage pain at this point in time.    Alden Benjamin, MD

## 2019-07-24 NOTE — Patient Instructions
General Instructions  For prompt and efficient communication, MyChart is preferred. Please sign up.  [email or web address]      To Contact Me:   Please send a MyChart message to your doctor or call [phone] to reach your doctor's nurse team. Please only call once in a 24-hour period. Leave a voicemail for the nurse to respond.  Calls or messages received after 4pm will be returned the next business day.  To Have Medication Refilled:   Please use the MyChart Refill request or contact your pharmacy directly to request medication refills. Please allow 72 hours.  To Receive Your Test Results:  ? Please allow 2 business day for labs to result in MyChart.  ? Please allow 4 business days for other tests, including cardiac studies, blood bank, and radiology results.  ? It can take up to 10 days for pathology  Results NOT released to MyChart:  ? OB Ultrasound  ? Genetic testing  ? Labs that are sent to outside facilities  Our Lab (Location, Hours, and Fasting Information)  Lab Location: the 1st floor of the Medical Office Building, directly to the left of the Information Desk.  Lab Hours: Monday 6:30 am-6 pm, Tuesday-Friday 7 am-6 pm, and Saturday 7 am-noon.   Fasting for Lab: For fasting labs, please:   Do not eat for at least 8-10 hours before having your labs drawn, drink plenty of water, and make sure to continue your medications as prescribed (unless otherwise specified).  Radiology is on the 2nd floor of the Medical Office Building. Please call [phone], option #1; Mammogram at [facility] location, please call [phone], option #1.  To Schedule an Appointment:   Contact our schedulers at [phone] and select #1 to make an appointment. At this time, you will be encouraged to signed up to MyChart if you not active.     To Receive Appointment Reminders on Your Cell Phone:   Make sure we have your cell phone number, and text [place] to [phone].  For Urgent Questions:   For medical emergencies, call 911.  On nights, weekends or holidays, call the Hospital operator at [phone], and ask for the ?Ob/Gyn Physician on call or Certified Nurse Midwife on call.?        We value your feedback and you may receive a survey about your experience with our office. If you had a favorable experience, the only positive survey results that we will receive are if we are rated in the 9 or 10 range out of 10 points. Please let us know why we did not earn 9-10 rating.  Thank you.

## 2019-07-25 DIAGNOSIS — R102 Pelvic and perineal pain: Principal | ICD-10-CM

## 2019-12-20 IMAGING — CR NECK
5 series · 5 of 5 positions shown · non-contrast
Comparison: none

[c-spine lat]
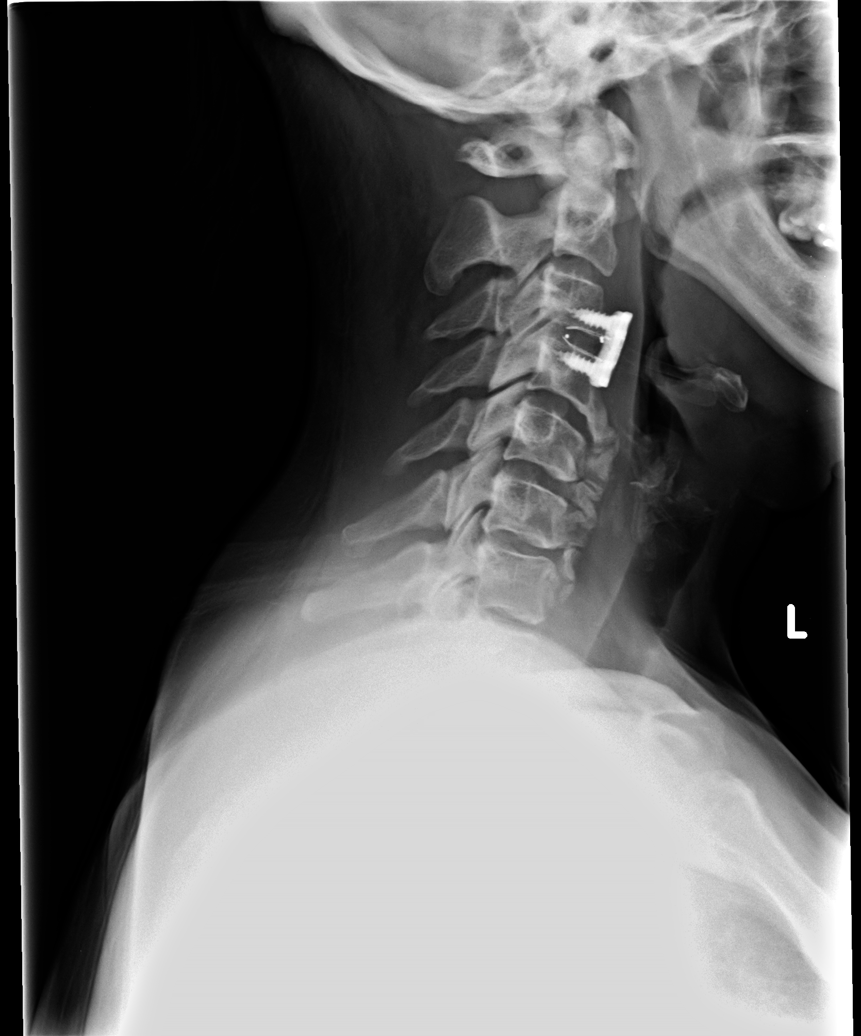

[c-spine ap]
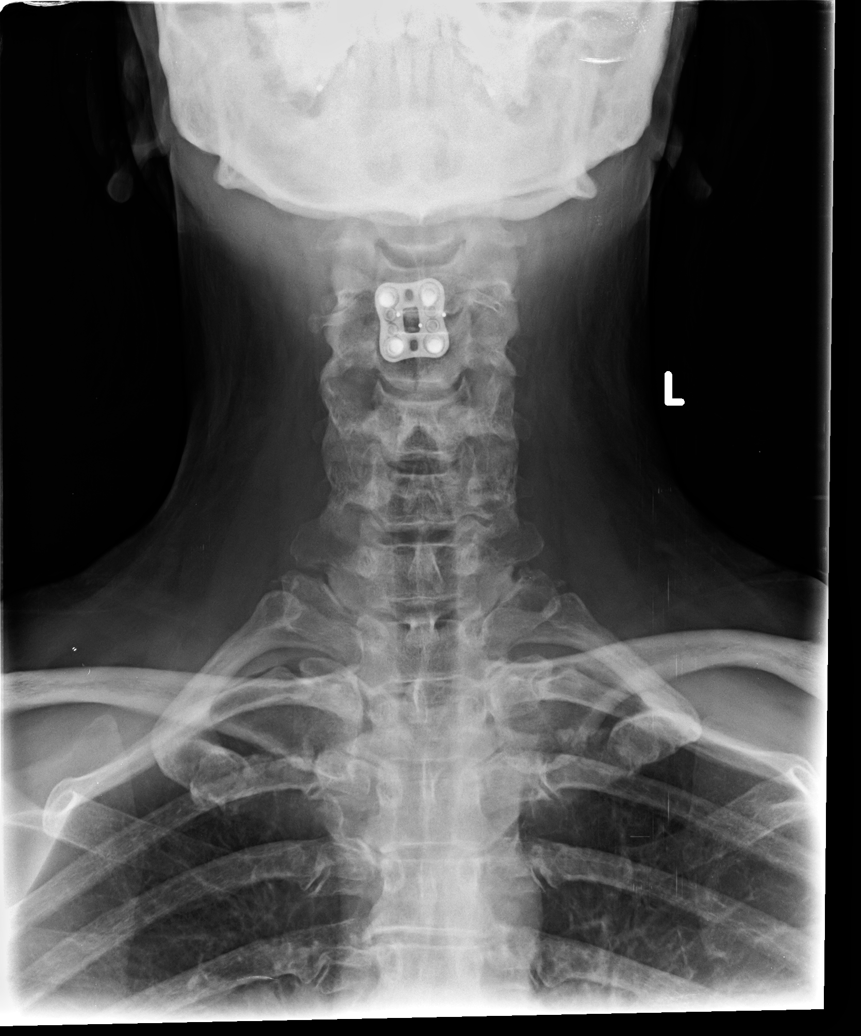

[c-spine obl (1 of 2)]
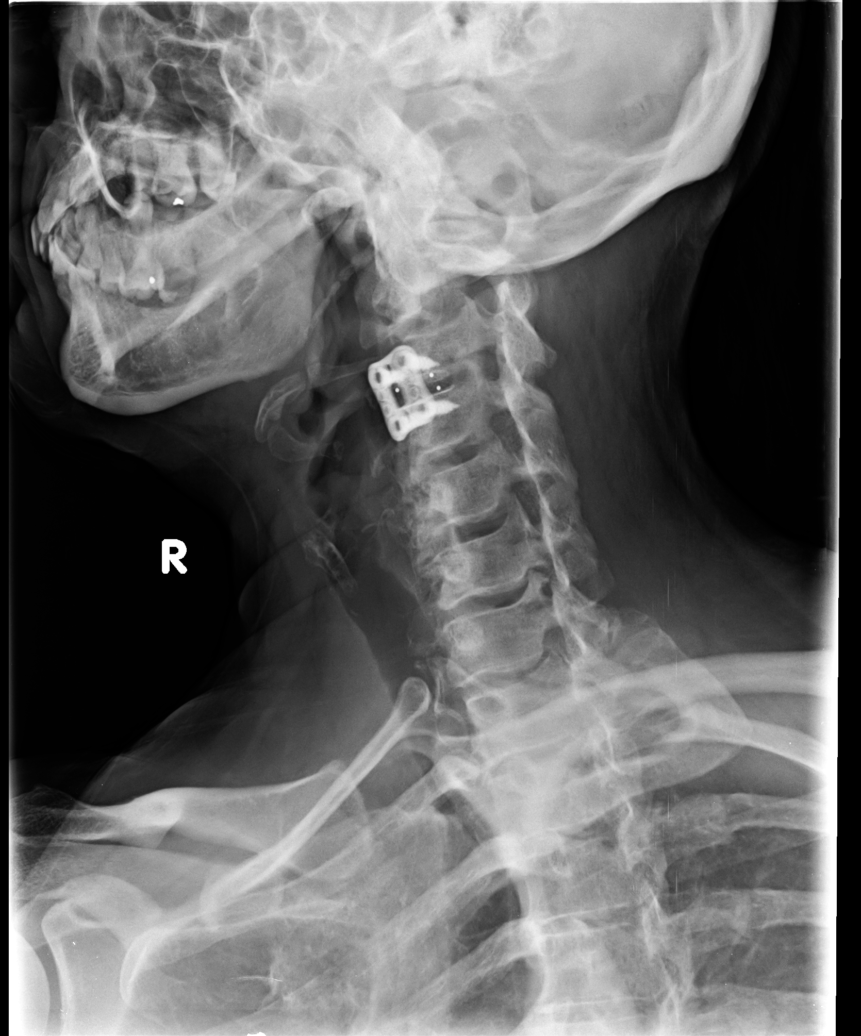

[c-spine obl (2 of 2)]
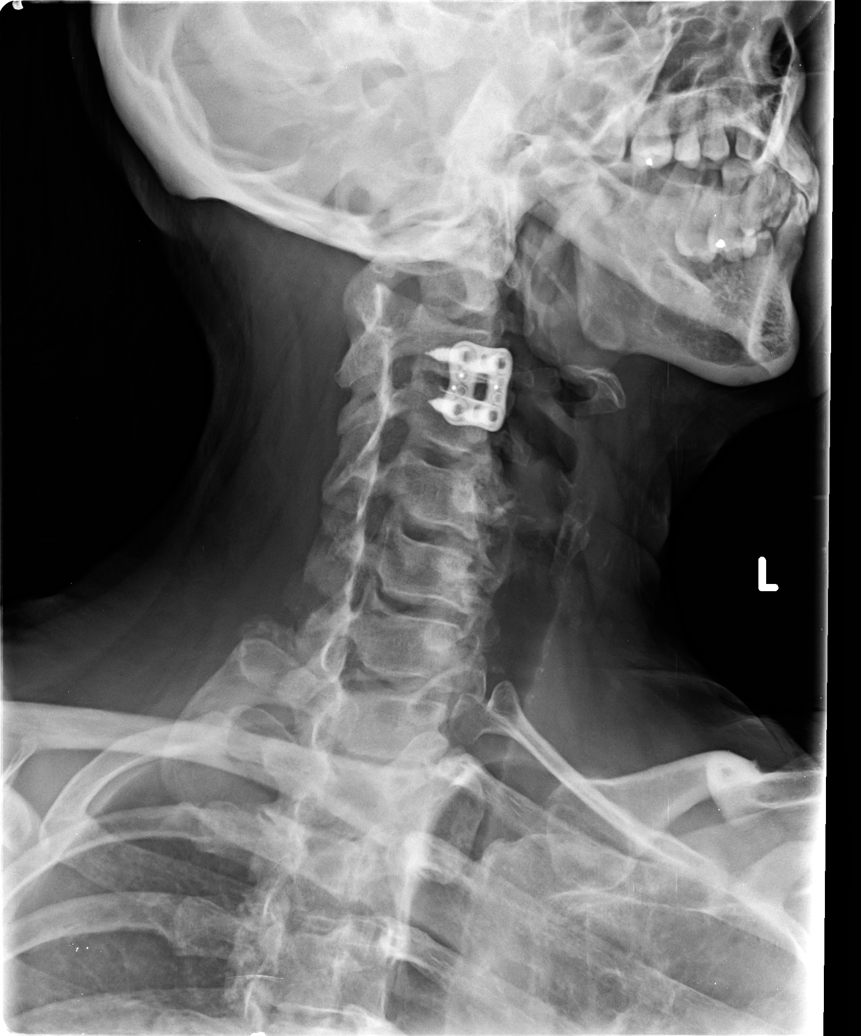

[odontoid]
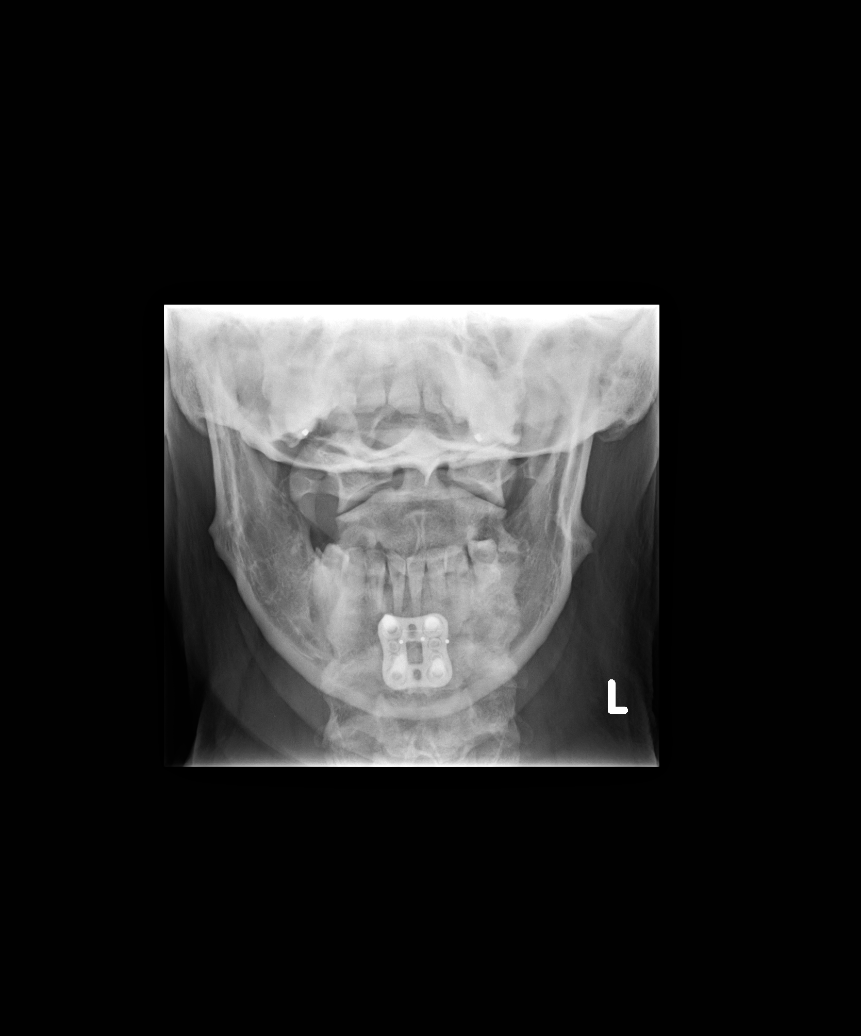

[5 of 5 positions shown; findings below may reference images not displayed]

EXAM

Complete cervical spine

INDICATION

stenosis of cervical spine with myelopathy; pt states she has had posterior neck pain with
headaches x2 days; hx of c-spine sx in November 2018

TECHNIQUE

AP, Lateral, and Oblique views were obtained

COMPARISONS

None available.

FINDINGS

The cervical vertebral body heights are grossly maintained with extensive anterior longitudinal
ligament with moderate prevertebral soft tissue prominence. There is mild facet arthrosis. There is
interval anterior cervical discectomy and fusion of C3-C4 level. The lateral masses of the C1 arch
is within normal limts. The lung apices are clear.

IMPRESSION

1. Interval anterior cervical discectomy and fusion without acute osseus abnormality.

Tech Notes:

stenosis of cervical spine with myelopathy; pt states she has had posterior neck pain with headaches
x2 days; hx of c-spine sx in November 2018

## 2020-01-09 ENCOUNTER — Encounter: Admit: 2020-01-09 | Discharge: 2020-01-09 | Payer: MEDICAID

## 2020-02-04 ENCOUNTER — Encounter: Admit: 2020-02-04 | Discharge: 2020-02-04 | Payer: MEDICAID

## 2020-02-05 ENCOUNTER — Encounter: Admit: 2020-02-05 | Discharge: 2020-02-05 | Payer: MEDICAID

## 2020-02-05 DIAGNOSIS — I10 Essential (primary) hypertension: Secondary | ICD-10-CM

## 2020-02-05 DIAGNOSIS — M549 Dorsalgia, unspecified: Secondary | ICD-10-CM

## 2020-02-05 DIAGNOSIS — F319 Bipolar disorder, unspecified: Secondary | ICD-10-CM

## 2020-02-05 DIAGNOSIS — N83209 Unspecified ovarian cyst, unspecified side: Secondary | ICD-10-CM

## 2020-02-05 DIAGNOSIS — R55 Syncope and collapse: Secondary | ICD-10-CM

## 2020-02-05 DIAGNOSIS — E162 Hypoglycemia, unspecified: Secondary | ICD-10-CM

## 2020-02-05 DIAGNOSIS — G43909 Migraine, unspecified, not intractable, without status migrainosus: Secondary | ICD-10-CM

## 2020-02-05 DIAGNOSIS — R0600 Dyspnea, unspecified: Secondary | ICD-10-CM

## 2020-02-05 DIAGNOSIS — J45909 Unspecified asthma, uncomplicated: Secondary | ICD-10-CM

## 2020-02-05 DIAGNOSIS — B977 Papillomavirus as the cause of diseases classified elsewhere: Secondary | ICD-10-CM

## 2020-02-05 DIAGNOSIS — A599 Trichomoniasis, unspecified: Secondary | ICD-10-CM

## 2020-02-05 DIAGNOSIS — R4020 Unspecified coma: Secondary | ICD-10-CM

## 2020-02-05 DIAGNOSIS — A749 Chlamydial infection, unspecified: Secondary | ICD-10-CM

## 2020-02-05 DIAGNOSIS — F419 Anxiety disorder, unspecified: Secondary | ICD-10-CM

## 2020-02-05 DIAGNOSIS — F329 Major depressive disorder, single episode, unspecified: Secondary | ICD-10-CM

## 2020-02-05 DIAGNOSIS — R9439 Abnormal result of other cardiovascular function study: Secondary | ICD-10-CM

## 2020-02-05 DIAGNOSIS — D649 Anemia, unspecified: Secondary | ICD-10-CM

## 2020-02-05 DIAGNOSIS — M1990 Unspecified osteoarthritis, unspecified site: Secondary | ICD-10-CM

## 2020-02-05 DIAGNOSIS — K219 Gastro-esophageal reflux disease without esophagitis: Secondary | ICD-10-CM

## 2020-02-05 DIAGNOSIS — E785 Hyperlipidemia, unspecified: Secondary | ICD-10-CM

## 2020-02-05 NOTE — Telephone Encounter
-----   Message from Weston Brass sent at 02/05/2020  2:57 PM CDT -----  Regarding: needs ccta  Let us know if issues getting sched.  If can't do [facility] wants cath.    Thanks  Brett Canales

## 2020-02-06 ENCOUNTER — Encounter: Admit: 2020-02-06 | Discharge: 2020-02-06 | Payer: MEDICAID

## 2020-02-06 DIAGNOSIS — R55 Syncope and collapse: Secondary | ICD-10-CM

## 2020-02-06 DIAGNOSIS — I10 Essential (primary) hypertension: Secondary | ICD-10-CM

## 2020-02-06 DIAGNOSIS — R9439 Abnormal result of other cardiovascular function study: Secondary | ICD-10-CM

## 2020-02-06 LAB — CBC
Lab: 33
Lab: 4.1 — ABNORMAL LOW (ref 4.20–5.40)

## 2020-02-06 LAB — COMPREHENSIVE METABOLIC PANEL
Lab: 0.4
Lab: 113
Lab: 16
Lab: 3.3 — ABNORMAL LOW (ref 3.5–5.0)
Lab: 7
Lab: 105 — ABNORMAL HIGH (ref 5–40)
Lab: 14
Lab: 25
Lab: 3.5 — ABNORMAL HIGH (ref <100)
Lab: 7 — ABNORMAL HIGH (ref 27.0–31.0)
Lab: 9.3
Lab: 93

## 2020-02-06 LAB — LIPID PROFILE
Lab: 191
Lab: 212 — ABNORMAL HIGH (ref <150)

## 2020-02-06 MED ORDER — NEBIVOLOL 10 MG PO TAB
ORAL_TABLET | ORAL | 0 refills | 60.00000 days | Status: AC
Start: 2020-02-06 — End: ?

## 2020-02-06 NOTE — Patient Instructions
Coronary CT Angiography Instructions    Diamond Morton  [id number]  [birth year]  02/06/2020    ARRIVAL TIME    Please report to the Cardiovascular Medicine Clinic at the [facility] on: 02/19/20  Please arrive at the following time: 11Am for CCTA         DO NOT EAT FOR 4 HOURS PRIOR TO YOUR PROCEDURE.May have toast or fruit before 8am day of test  YOU SHOULD DRINK PLENTY OF CLEAR LIQUIDS UP TO ARRIVAL AT OFFICE.    Pre-Procedure Heart Rate Medication Instructions: Take Bystolic 10mg  0n 9/27 @ 8PM and 9/28 @ 8AM  and your BP med the am of test - may take all other meds after CCTA scan It is important to take these medications exactly as written.  These medications prepare you for the procedure. May take Losartan the AM of test                                      Do not take any non-steroidal inflammatory medications, (ibuprofen, Aleve, Advil, etc.) for 48 hours beginning the day of the procedure.    Hold All Diuretics For 24 Hours Beginning The Day Of The Procedure: Verified                   ALLERGIES    Allergies   Allergen Reactions   ? Fentanyl SEE COMMENTS     Numbness in legs when got IV dose   ? Onion ANAPHYLAXIS   ? Pcn [Penicillins] SEE COMMENTS     COMA.   ? Ancef [Cefazolin] RASH   ? Sulfa (Sulfonamide Antibiotics) HIVES       SPECIAL ALLERGY INSTRUCTIONS         CURRENT MEDICATIONS  Outpatient Encounter Medications as of 02/06/2020   Medication Sig Dispense Refill   ? acetaminophen (TYLENOL) 500 mg tablet Take 1,000 mg by mouth daily as needed for Pain. Max of 4,000 mg of acetaminophen in 24 hours.     ? aspirin EC 81 mg tablet      ? cyclobenzaprine (FLEXERIL) 10 mg tablet Take 20 mg by mouth twice daily.     ? diphenhydrAMINE (BENADRYL) 50 mg capsule Take 100 mg by mouth daily as needed.     ? DULoxetine 60 mg CDRS Take 60 mg by mouth daily.     ? ferrous sulfate (FEOSOL, FEROSUL) 325 mg (65 mg iron) tablet Take 325 mg by mouth every morning.     ? HYDROcodone/acetaminophen (NORCO) 5/325 mg tablet TAKE 1 TO 2 TABLETS BY MOUTH EVERY 6 TO 8 HOURS AS NEEDED FOR SEVERE PAIN (SCORE 7 10)     ? loratadine (CLARITIN) 10 mg tablet Take 20 mg by mouth daily. Indications: allergies     ? losartan(+) (COZAAR) 100 mg tablet Take 100 mg by mouth every morning.  3   ? methocarbamoL (ROBAXIN) 750 mg tablet Take one tablet by mouth every 8 hours as needed. (Patient taking differently: Take 750 mg by mouth four times daily as needed.) 40 tablet 1   ? [START ON 02/18/2020] nebivoloL (BYSTOLIC) 10 mg tablet Take bystolic 10mg  on 9/27 @ 8PM and 9/28 @ 8AM  Indications: ccta PREP 2 tablet 0   ? traZODone (DESYREL) 50 mg tablet        No facility-administered encounter medications on file as of 02/06/2020.       -  No caffeine or smoking after midnight before the procedure.  -Please arrange for someone to drive you home after the procedure.  -No driving for 3 hours after the procedure.  -Please leave all valuables at home (wallet/purse).              PRE-PROCEDURE LAB WORK    Other Lab: Urine HCG upon arrival if needed    If you have any questions, please call the [facility] office at [phone] or [phone].    Form Completed By: Kevan Ny RN  Date Completed: 02/06/20

## 2020-02-06 NOTE — Progress Notes
Ordering Physician:Love  Type of Monitor:Auto-trigger looping  Length of Study:30 day  RU:[id number]

## 2020-02-11 ENCOUNTER — Encounter: Admit: 2020-02-11 | Discharge: 2020-02-11 | Payer: MEDICAID

## 2020-02-11 MED ORDER — ROSUVASTATIN 5 MG PO TAB
5 mg | ORAL_TABLET | Freq: Every day | ORAL | 1 refills | 90.00000 days | Status: AC
Start: 2020-02-11 — End: ?

## 2020-02-11 NOTE — Telephone Encounter
-----   Message from Altamease Oiler, MD sent at 02/08/2020  2:18 PM CDT -----  I think given the concern for possible obstructive coronary artery disease we should go ahead and proceed with starting her on statin.  Lets try rosuvastatin 5 mg nightly.  Plan to repeat fasting lipid panel in 3 months.Thanks!  ----- Message -----  From: Rogelia Boga, RN  Sent: 02/06/2020  12:52 PM CDT  To: Altamease Oiler, MD    Labs for your review.  Pt is not on a cholesterol medication.  Let me know if you have any recommendations.  Thanks!

## 2020-02-11 NOTE — Telephone Encounter
Called and discussed results and recommendations with patient.  She is agreeable to plan.  New script sent to pharmacy.

## 2020-02-13 ENCOUNTER — Encounter: Admit: 2020-02-13 | Discharge: 2020-02-13 | Payer: MEDICAID

## 2020-02-14 ENCOUNTER — Encounter: Admit: 2020-02-14 | Discharge: 2020-02-14 | Payer: MEDICAID

## 2020-02-14 MED ORDER — METOPROLOL SUCCINATE 50 MG PO TB24
ORAL_TABLET | ORAL | 0 refills | 90.00000 days | Status: AC
Start: 2020-02-14 — End: ?

## 2020-02-15 ENCOUNTER — Encounter: Admit: 2020-02-15 | Discharge: 2020-02-15 | Payer: MEDICAID

## 2020-02-15 DIAGNOSIS — Z8041 Family history of malignant neoplasm of ovary: Secondary | ICD-10-CM

## 2020-02-19 ENCOUNTER — Ambulatory Visit: Admit: 2020-02-19 | Discharge: 2020-02-19 | Payer: MEDICAID

## 2020-02-19 ENCOUNTER — Encounter: Admit: 2020-02-19 | Discharge: 2020-02-19 | Payer: MEDICAID

## 2020-02-19 DIAGNOSIS — R55 Syncope and collapse: Secondary | ICD-10-CM

## 2020-02-19 DIAGNOSIS — R9439 Abnormal result of other cardiovascular function study: Secondary | ICD-10-CM

## 2020-02-19 DIAGNOSIS — R0600 Dyspnea, unspecified: Secondary | ICD-10-CM

## 2020-02-19 DIAGNOSIS — I10 Essential (primary) hypertension: Secondary | ICD-10-CM

## 2020-02-19 MED ORDER — NITROGLYCERIN 0.4 MG SL SUBL
.4 mg | SUBLINGUAL | 0 refills | Status: AC | PRN
Start: 2020-02-19 — End: ?

## 2020-02-19 MED ORDER — IOPAMIDOL 76 % IV SOLN
100 mL | Freq: Once | INTRAVENOUS | 0 refills | Status: CP
Start: 2020-02-19 — End: ?
  Administered 2020-02-19: 17:00:00 100 mL via INTRAVENOUS

## 2020-02-19 MED ORDER — DIPHENHYDRAMINE HCL 50 MG/ML IJ SOLN
50 mg | Freq: Once | INTRAVENOUS | 0 refills | Status: AC | PRN
Start: 2020-02-19 — End: ?

## 2020-02-19 MED ORDER — METHYLPREDNISOLONE SOD SUC(PF) 125 MG/2 ML IJ SOLR
125 mg | Freq: Once | INTRAVENOUS | 0 refills | Status: AC | PRN
Start: 2020-02-19 — End: ?

## 2020-02-19 MED ORDER — SODIUM CHLORIDE 0.9 % IV SOLP
250 mL | INTRAVENOUS | 0 refills | Status: AC | PRN
Start: 2020-02-19 — End: ?

## 2020-02-19 MED ORDER — METOPROLOL TARTRATE 5 MG/5 ML IV SOLN
5 mg | INTRAVENOUS | 0 refills | Status: AC | PRN
Start: 2020-02-19 — End: ?
  Administered 2020-02-19: 17:00:00 5 mg via INTRAVENOUS

## 2020-02-19 MED ORDER — SODIUM CHLORIDE 0.9 % IJ SOLN
100 mL | Freq: Once | INTRAVENOUS | 0 refills | Status: CP
Start: 2020-02-19 — End: ?
  Administered 2020-02-19: 17:00:00 100 mL via INTRAVENOUS

## 2020-02-19 MED ORDER — IVABRADINE 5 MG PO TAB
15 mg | Freq: Once | ORAL | 0 refills | Status: AC | PRN
Start: 2020-02-19 — End: ?

## 2020-02-19 MED ORDER — DIPHENHYDRAMINE HCL 50 MG PO CAP
50 mg | Freq: Once | ORAL | 0 refills | Status: AC | PRN
Start: 2020-02-19 — End: ?

## 2020-02-19 MED ORDER — SODIUM CHLORIDE 0.9 % IV SOLP
250 mL | INTRAVENOUS | 0 refills | Status: AC
Start: 2020-02-19 — End: ?

## 2020-02-19 NOTE — Telephone Encounter
Results and recommendations called to patient.  Routed to PCP.  With request for follow up CT in 12 months.

## 2020-02-19 NOTE — Telephone Encounter
-----   Message from Altamease Oiler, MD sent at 02/19/2020  4:52 PM CDT -----  [facility] guys,Would you mind reaching out to Jeanise and letting her know that her coronary CTA results have returned. She has very mild plaque in her left anterior descending artery. Certainly not enough stenosis to be causing chest pain. The remainder of her arteries looked great. At this point we should continue to aggressively treat her dyslipidemia and hypertension.She did have evidence of 2 3 mm lung nodules and given her smoking history a follow-up CT chest was recommended in 12 months. Could we either order this or pass the information along to [facility]?Thanks!

## 2020-03-14 ENCOUNTER — Ambulatory Visit: Admit: 2020-03-14 | Discharge: 2020-03-14 | Payer: MEDICAID

## 2020-03-14 ENCOUNTER — Encounter: Admit: 2020-03-14 | Discharge: 2020-03-14 | Payer: MEDICAID

## 2020-03-14 DIAGNOSIS — R9439 Abnormal result of other cardiovascular function study: Secondary | ICD-10-CM

## 2020-03-14 DIAGNOSIS — R55 Syncope and collapse: Secondary | ICD-10-CM

## 2020-03-14 DIAGNOSIS — R0600 Dyspnea, unspecified: Secondary | ICD-10-CM

## 2020-03-14 DIAGNOSIS — I10 Essential (primary) hypertension: Secondary | ICD-10-CM

## 2020-03-24 ENCOUNTER — Encounter: Admit: 2020-03-24 | Discharge: 2020-03-24 | Payer: MEDICAID

## 2020-03-24 NOTE — Telephone Encounter
-----   Message from Altamease Oiler, MD sent at 03/24/2020 11:38 AM CDT -----  They guys,Would you mind letting Diamond Morton know that her cardiac monitor looked okay.  No arrhythmia to correlate with her reported symptoms.      We will discuss further in clinic later this month.    Thanks!

## 2020-03-24 NOTE — Telephone Encounter
Called and discussed results with patient.  No questions at this time.  Pt will callback with any questions, concerns or problems.

## 2020-04-22 ENCOUNTER — Encounter: Admit: 2020-04-22 | Discharge: 2020-04-22 | Payer: MEDICAID

## 2020-04-22 DIAGNOSIS — I10 Essential (primary) hypertension: Secondary | ICD-10-CM

## 2020-04-22 DIAGNOSIS — F319 Bipolar disorder, unspecified: Secondary | ICD-10-CM

## 2020-04-22 DIAGNOSIS — E785 Hyperlipidemia, unspecified: Secondary | ICD-10-CM

## 2020-04-22 DIAGNOSIS — M1990 Unspecified osteoarthritis, unspecified site: Secondary | ICD-10-CM

## 2020-04-22 DIAGNOSIS — N83209 Unspecified ovarian cyst, unspecified side: Secondary | ICD-10-CM

## 2020-04-22 DIAGNOSIS — A599 Trichomoniasis, unspecified: Secondary | ICD-10-CM

## 2020-04-22 DIAGNOSIS — E162 Hypoglycemia, unspecified: Secondary | ICD-10-CM

## 2020-04-22 DIAGNOSIS — J45909 Unspecified asthma, uncomplicated: Secondary | ICD-10-CM

## 2020-04-22 DIAGNOSIS — B977 Papillomavirus as the cause of diseases classified elsewhere: Secondary | ICD-10-CM

## 2020-04-22 DIAGNOSIS — R55 Syncope and collapse: Secondary | ICD-10-CM

## 2020-04-22 DIAGNOSIS — A749 Chlamydial infection, unspecified: Secondary | ICD-10-CM

## 2020-04-22 DIAGNOSIS — F419 Anxiety disorder, unspecified: Secondary | ICD-10-CM

## 2020-04-22 DIAGNOSIS — D649 Anemia, unspecified: Secondary | ICD-10-CM

## 2020-04-22 DIAGNOSIS — R4020 Unspecified coma: Secondary | ICD-10-CM

## 2020-04-22 DIAGNOSIS — K219 Gastro-esophageal reflux disease without esophagitis: Secondary | ICD-10-CM

## 2020-04-22 DIAGNOSIS — F32A Depression: Secondary | ICD-10-CM

## 2020-04-22 DIAGNOSIS — M549 Dorsalgia, unspecified: Secondary | ICD-10-CM

## 2020-04-22 DIAGNOSIS — G43909 Migraine, unspecified, not intractable, without status migrainosus: Secondary | ICD-10-CM

## 2020-04-22 DIAGNOSIS — R079 Chest pain, unspecified: Secondary | ICD-10-CM

## 2020-04-22 MED ORDER — AMLODIPINE 5 MG PO TAB
5 mg | ORAL_TABLET | Freq: Every day | ORAL | 1 refills | Status: AC
Start: 2020-04-22 — End: ?

## 2020-04-22 MED ORDER — LOSARTAN 50 MG PO TAB
50 mg | ORAL_TABLET | Freq: Every morning | ORAL | 3 refills | 30.00000 days | Status: AC
Start: 2020-04-22 — End: ?

## 2020-04-22 NOTE — Progress Notes
Records Request    Diamond Morton DOB: 07-Mar-1976    STAT- pt in office NOW    Medical records request for continuation of care:    Patient has appointment NOW  with Dr. Sandria Manly .    Please fax records to Cardiovascular Medicine [facility] [phone]    Request records:    ER Note from 11/10    Any cardiac-related records    Recent Labs    Procedures    H&P/Discharge Summary    Operative Reports- Cardiac        Thank you,      Cardiovascular Medicine  [facility]  [address]  [place], [place] [address]  Phone:  [phone]  Fax:  [phone]

## 2020-05-12 ENCOUNTER — Encounter: Admit: 2020-05-12 | Discharge: 2020-05-12 | Payer: MEDICAID

## 2020-07-22 ENCOUNTER — Encounter: Admit: 2020-07-22 | Discharge: 2020-07-22 | Payer: MEDICAID

## 2020-07-22 NOTE — Progress Notes
Contacted patient by phone on 07/22/2020 regarding lab order for Lipid Panel ordered per WTL that has not been completed. Patient stated that she plans to have this lab drawn at [facility] in [place], [place] a few days before her scheduled appointment with Dr. Sandria Manly in [place], [place] on 07/29/2020. Patient stated she still has the lab order that was given to her at her last office visit with Dr. Sandria Manly and will bring that with her to the lab draw. Notified patient of fasting instructions for lab draw. Patient voiced verbal understanding.

## 2020-07-25 ENCOUNTER — Encounter: Admit: 2020-07-25 | Discharge: 2020-07-25 | Payer: MEDICAID

## 2020-07-25 DIAGNOSIS — R079 Chest pain, unspecified: Secondary | ICD-10-CM

## 2020-07-25 DIAGNOSIS — E785 Hyperlipidemia, unspecified: Secondary | ICD-10-CM

## 2020-07-25 DIAGNOSIS — I10 Essential (primary) hypertension: Secondary | ICD-10-CM

## 2020-07-25 LAB — LIPID PROFILE
Lab: 100
Lab: 130
Lab: 162
Lab: 26
Lab: 36 — ABNORMAL LOW (ref >=40)
Lab: 5

## 2020-07-28 ENCOUNTER — Encounter: Admit: 2020-07-28 | Discharge: 2020-07-28 | Payer: MEDICAID

## 2020-07-28 DIAGNOSIS — E785 Hyperlipidemia, unspecified: Secondary | ICD-10-CM

## 2020-07-28 MED ORDER — ROSUVASTATIN 10 MG PO TAB
10 mg | ORAL_TABLET | Freq: Every day | ORAL | 3 refills | 90.00000 days | Status: AC
Start: 2020-07-28 — End: ?

## 2020-07-28 NOTE — Telephone Encounter
Left message with results and recommendations.  New script sent to pharmacy. Left callback number for any questions or concerns.

## 2020-07-28 NOTE — Telephone Encounter
-----   Message from Altamease Oiler, MD sent at 07/25/2020  2:29 PM CST -----  If she is okay with it we can increase her rosuvastatin to 10 mg daily.  Repeat a fasting lipid panel in 3 months.  Thanks Asher Muir!  ----- Message -----  From: Rogelia Boga, RN  Sent: 07/25/2020   2:21 PM CST  To: Altamease Oiler, MD    Labs for your review, LDL not at goal.  Pt is on rosuvastatin 5mg  daily.  She has follow up with you next week.  Please let me know if you want to make any changes prior to her office visit.  Thanks!

## 2020-07-29 ENCOUNTER — Encounter: Admit: 2020-07-29 | Discharge: 2020-07-29 | Payer: MEDICAID

## 2020-07-29 DIAGNOSIS — J45909 Unspecified asthma, uncomplicated: Secondary | ICD-10-CM

## 2020-07-29 DIAGNOSIS — R4020 Unspecified coma: Secondary | ICD-10-CM

## 2020-07-29 DIAGNOSIS — E785 Hyperlipidemia, unspecified: Secondary | ICD-10-CM

## 2020-07-29 DIAGNOSIS — B977 Papillomavirus as the cause of diseases classified elsewhere: Secondary | ICD-10-CM

## 2020-07-29 DIAGNOSIS — M549 Dorsalgia, unspecified: Secondary | ICD-10-CM

## 2020-07-29 DIAGNOSIS — R079 Chest pain, unspecified: Secondary | ICD-10-CM

## 2020-07-29 DIAGNOSIS — F32A Depression: Secondary | ICD-10-CM

## 2020-07-29 DIAGNOSIS — F319 Bipolar disorder, unspecified: Secondary | ICD-10-CM

## 2020-07-29 DIAGNOSIS — I10 Essential (primary) hypertension: Secondary | ICD-10-CM

## 2020-07-29 DIAGNOSIS — G43909 Migraine, unspecified, not intractable, without status migrainosus: Secondary | ICD-10-CM

## 2020-07-29 DIAGNOSIS — E162 Hypoglycemia, unspecified: Secondary | ICD-10-CM

## 2020-07-29 DIAGNOSIS — D649 Anemia, unspecified: Secondary | ICD-10-CM

## 2020-07-29 DIAGNOSIS — F419 Anxiety disorder, unspecified: Secondary | ICD-10-CM

## 2020-07-29 DIAGNOSIS — A749 Chlamydial infection, unspecified: Secondary | ICD-10-CM

## 2020-07-29 DIAGNOSIS — M1990 Unspecified osteoarthritis, unspecified site: Secondary | ICD-10-CM

## 2020-07-29 DIAGNOSIS — R55 Syncope and collapse: Secondary | ICD-10-CM

## 2020-07-29 DIAGNOSIS — K219 Gastro-esophageal reflux disease without esophagitis: Secondary | ICD-10-CM

## 2020-07-29 DIAGNOSIS — A599 Trichomoniasis, unspecified: Secondary | ICD-10-CM

## 2020-07-29 DIAGNOSIS — N83209 Unspecified ovarian cyst, unspecified side: Secondary | ICD-10-CM

## 2020-07-29 NOTE — Patient Instructions
Labs    Follow up as directed.  Call sooner if issues.  Call the [facility] nursing line at [phone].  Leave a detailed message for the nurse in [place] with how we can assist you and we will call you back.

## 2020-09-27 ENCOUNTER — Encounter: Admit: 2020-09-27 | Discharge: 2020-09-27 | Payer: MEDICAID

## 2020-09-27 MED ORDER — AMLODIPINE 5 MG PO TAB
ORAL_TABLET | Freq: Every day | 1 refills
Start: 2020-09-27 — End: ?

## 2020-10-28 ENCOUNTER — Encounter: Admit: 2020-10-28 | Discharge: 2020-10-28 | Payer: MEDICAID

## 2020-10-29 ENCOUNTER — Encounter: Admit: 2020-10-29 | Discharge: 2020-10-29 | Payer: MEDICAID

## 2020-10-29 DIAGNOSIS — E785 Hyperlipidemia, unspecified: Secondary | ICD-10-CM

## 2020-10-29 DIAGNOSIS — R079 Chest pain, unspecified: Secondary | ICD-10-CM

## 2020-10-29 DIAGNOSIS — I10 Essential (primary) hypertension: Secondary | ICD-10-CM

## 2020-10-29 LAB — LIPID PROFILE
CHOLESTEROL/HDL %: 4
CHOLESTEROL: 130
HDL: 34 — ABNORMAL LOW (ref >40)
LDL: 70
TRIGLYCERIDES: 132
VLDL: 26

## 2020-10-29 NOTE — Telephone Encounter
Left message with normal test results and to continue with current medications. Phone number provided for any questions or concerns.

## 2020-10-29 NOTE — Telephone Encounter
-----   Message from Altamease Oiler, MD sent at 10/29/2020  1:03 PM CDT -----  These look great.  No changes.  Thanks Damany Eastman!  ----- Message -----  From: Floy Sabina, RN  Sent: 10/29/2020   1:02 PM CDT  To: Altamease Oiler, MD    Labs for your review and recommendation. 3 month follow up after starting crestor 10mg . Thanks!

## 2021-01-21 ENCOUNTER — Encounter: Admit: 2021-01-21 | Discharge: 2021-01-21 | Payer: MEDICAID

## 2021-04-08 ENCOUNTER — Encounter: Admit: 2021-04-08 | Discharge: 2021-04-08 | Payer: MEDICAID

## 2021-04-08 NOTE — Progress Notes
Phone call placed to PCP on file. LVM with Victorino Dike, RN regarding recommendations made on CT LMTD Chest dated 02/19/2020.  RN verified receipt of records and sent provider message to return call to discuss recommendations.

## 2021-04-21 ENCOUNTER — Encounter: Admit: 2021-04-21 | Discharge: 2021-04-21 | Payer: MEDICAID

## 2021-04-21 NOTE — Progress Notes
Chest CTs dated 03/19/2021 & 04/02/2021 received from from outside provider. This was completed from the recommendations made on CT LMTD Chest dated 02/19/2020. Results reviewed and scanned into chart.

## 2021-04-23 ENCOUNTER — Encounter: Admit: 2021-04-23 | Discharge: 2021-04-23 | Payer: MEDICAID

## 2021-04-28 ENCOUNTER — Encounter: Admit: 2021-04-28 | Discharge: 2021-04-28 | Payer: MEDICAID

## 2021-04-28 DIAGNOSIS — K219 Gastro-esophageal reflux disease without esophagitis: Secondary | ICD-10-CM

## 2021-04-28 DIAGNOSIS — G43909 Migraine, unspecified, not intractable, without status migrainosus: Secondary | ICD-10-CM

## 2021-04-28 DIAGNOSIS — E162 Hypoglycemia, unspecified: Secondary | ICD-10-CM

## 2021-04-28 DIAGNOSIS — I10 Essential (primary) hypertension: Secondary | ICD-10-CM

## 2021-04-28 DIAGNOSIS — A749 Chlamydial infection, unspecified: Secondary | ICD-10-CM

## 2021-04-28 DIAGNOSIS — E785 Hyperlipidemia, unspecified: Secondary | ICD-10-CM

## 2021-04-28 DIAGNOSIS — F419 Anxiety disorder, unspecified: Secondary | ICD-10-CM

## 2021-04-28 DIAGNOSIS — R4020 Unspecified coma: Secondary | ICD-10-CM

## 2021-04-28 DIAGNOSIS — M549 Dorsalgia, unspecified: Secondary | ICD-10-CM

## 2021-04-28 DIAGNOSIS — M1990 Unspecified osteoarthritis, unspecified site: Secondary | ICD-10-CM

## 2021-04-28 DIAGNOSIS — F319 Bipolar disorder, unspecified: Secondary | ICD-10-CM

## 2021-04-28 DIAGNOSIS — F32A Depression: Secondary | ICD-10-CM

## 2021-04-28 DIAGNOSIS — R55 Syncope and collapse: Secondary | ICD-10-CM

## 2021-04-28 DIAGNOSIS — B977 Papillomavirus as the cause of diseases classified elsewhere: Secondary | ICD-10-CM

## 2021-04-28 DIAGNOSIS — A599 Trichomoniasis, unspecified: Secondary | ICD-10-CM

## 2021-04-28 DIAGNOSIS — D649 Anemia, unspecified: Secondary | ICD-10-CM

## 2021-04-28 DIAGNOSIS — N83209 Unspecified ovarian cyst, unspecified side: Secondary | ICD-10-CM

## 2021-04-28 DIAGNOSIS — Z136 Encounter for screening for cardiovascular disorders: Secondary | ICD-10-CM

## 2021-04-28 DIAGNOSIS — J45909 Unspecified asthma, uncomplicated: Secondary | ICD-10-CM

## 2021-05-04 ENCOUNTER — Encounter: Admit: 2021-05-04 | Discharge: 2021-05-04 | Payer: MEDICAID

## 2021-05-04 MED ORDER — LOSARTAN 50 MG PO TAB
ORAL_TABLET | Freq: Every day | ORAL | 3 refills | 90.00000 days | Status: AC
Start: 2021-05-04 — End: ?

## 2021-07-19 ENCOUNTER — Encounter: Admit: 2021-07-19 | Discharge: 2021-07-19 | Payer: MEDICAID

## 2021-07-19 MED ORDER — ROSUVASTATIN 10 MG PO TAB
ORAL_TABLET | 3 refills
Start: 2021-07-19 — End: ?

## 2021-08-03 ENCOUNTER — Encounter: Admit: 2021-08-03 | Discharge: 2021-08-03 | Payer: MEDICAID

## 2021-08-03 MED ORDER — AMLODIPINE 5 MG PO TAB
ORAL_TABLET | 3 refills | Status: AC
Start: 2021-08-03 — End: ?

## 2022-03-11 ENCOUNTER — Encounter: Admit: 2022-03-11 | Discharge: 2022-03-11 | Payer: MEDICAID

## 2022-05-08 ENCOUNTER — Encounter: Admit: 2022-05-08 | Discharge: 2022-05-08 | Payer: MEDICAID

## 2022-05-08 MED ORDER — LOSARTAN 50 MG PO TAB
ORAL_TABLET | 3 refills
Start: 2022-05-08 — End: ?

## 2022-05-10 ENCOUNTER — Encounter: Admit: 2022-05-10 | Discharge: 2022-05-10 | Payer: MEDICAID

## 2023-06-30 ENCOUNTER — Encounter: Admit: 2023-06-30 | Discharge: 2023-06-30 | Payer: MEDICAID
